# Patient Record
Sex: Female | Born: 2000 | Race: Black or African American | Hispanic: No | Marital: Single | Smoking: Never smoker
Health system: Southern US, Community
[De-identification: ages and names within clinical notes are randomized; demographics above are authoritative.]

## PROBLEM LIST (undated history)

## (undated) HISTORY — PX: DILATION AND CURETTAGE OF UTERUS: SHX78

---

## 2022-04-29 ENCOUNTER — Emergency Department (HOSPITAL_COMMUNITY)
Admission: EM | Admit: 2022-04-29 | Discharge: 2022-04-30 | Disposition: A | Discharge status: Home / Self Care | Payer: Medicaid - Out of State | Attending: Emergency Medicine | Admitting: Emergency Medicine

## 2022-04-29 ENCOUNTER — Emergency Department (HOSPITAL_COMMUNITY): Payer: Medicaid - Out of State

## 2022-04-29 DIAGNOSIS — R002 Palpitations: Secondary | ICD-10-CM | POA: Insufficient documentation

## 2022-04-29 DIAGNOSIS — I420 Dilated cardiomyopathy: Secondary | ICD-10-CM | POA: Diagnosis not present

## 2022-04-29 DIAGNOSIS — N9489 Other specified conditions associated with female genital organs and menstrual cycle: Secondary | ICD-10-CM | POA: Insufficient documentation

## 2022-04-29 DIAGNOSIS — R197 Diarrhea, unspecified: Secondary | ICD-10-CM | POA: Insufficient documentation

## 2022-04-29 DIAGNOSIS — R42 Dizziness and giddiness: Secondary | ICD-10-CM | POA: Diagnosis present

## 2022-04-29 DIAGNOSIS — R531 Weakness: Secondary | ICD-10-CM | POA: Diagnosis not present

## 2022-04-29 DIAGNOSIS — N390 Urinary tract infection, site not specified: Secondary | ICD-10-CM | POA: Insufficient documentation

## 2022-04-29 LAB — I-STAT BETA HCG BLOOD, ED (MC, WL, AP ONLY): I-stat hCG, quantitative: <5 m[IU]/mL (ref <5)

## 2022-04-29 LAB — CBC
HCT: 38.2 % (ref 36.0–46.0)
Hemoglobin: 12.3 g/dL (ref 12.0–15.0)
MCH: 24.5 pg — ABNORMAL LOW (ref 26.0–34.0)
MCHC: 32.2 g/dL (ref 30.0–36.0)
MCV: 75.9 fL — ABNORMAL LOW (ref 80.0–100.0)
Platelets: 427 10*3/uL — ABNORMAL HIGH (ref 150–400)
RBC: 5.03 MIL/uL (ref 3.87–5.11)
RDW: 14.5 % (ref 11.5–15.5)
WBC: 6.5 10*3/uL (ref 4.0–10.5)
nRBC: 0 % (ref 0.0–0.2)

## 2022-04-29 LAB — BASIC METABOLIC PANEL
Anion gap: 6 (ref 5–15)
BUN: 11 mg/dL (ref 6–20)
CO2: 26 mmol/L (ref 22–32)
Calcium: 9.5 mg/dL (ref 8.9–10.3)
Chloride: 102 mmol/L (ref 98–111)
Creatinine, Ser: 0.54 mg/dL (ref 0.44–1.00)
GFR, Estimated: >60 mL/min (ref >60)
Glucose, Bld: 73 mg/dL (ref 70–99)
Potassium: 3.5 mmol/L (ref 3.5–5.1)
Sodium: 134 mmol/L — ABNORMAL LOW (ref 135–145)

## 2022-04-29 LAB — TROPONIN I (HIGH SENSITIVITY): Troponin I (High Sensitivity): 2 ng/L (ref <18)

## 2022-04-29 NOTE — ED Triage Notes (Signed)
Pt c/o palpitations, feeling of lightheaded/ weakness, associated SHOB, dizziness x4 days. Denies syncopal episode, N/V, fevers, abd pain. Advises she has "a larger R side of heart w arteries that come out of it." Moved to  73mo ago ?Interpreter [phone] ?

## 2022-04-29 NOTE — ED Provider Triage Note (Signed)
Emergency Medicine Provider Triage Evaluation Note ? ?Susan Carroll , a 21 y.o. female  was evaluated in triage.  Pt complains of palpitations since Friday.  Patient reports that "she has a larger right side than left and blood shoots out of my arteries".  Patient states that she has a history of palpitations.  Patient states that she recently moved down to [place] however she has seen cardiologist in [place].  Patient endorsing lightheadedness, dizziness, weakness.  Patient denies any shortness of breath, loss of consciousness.  Patient also endorsing 1 episode of abdominal pain with blood in stool on Friday. ? ?Review of Systems  ?Positive:  ?Negative:  ? ?Physical Exam  ?There were no vitals taken for this visit. ?Gen:   Awake, no distress   ?Resp:  Normal effort  ?MSK:   Moves extremities without difficulty  ?Other:   ? ?Medical Decision Making  ?Medically screening exam initiated at 4:40 PM.  Appropriate orders placed.  Susan Carroll was informed that the remainder of the evaluation will be completed by another provider, this initial triage assessment does not replace that evaluation, and the importance of remaining in the ED until their evaluation is complete. ? ? ?  ?Azucena Cecil, PA-C ?04/29/22 1641 ? ?

## 2022-04-30 LAB — URINALYSIS, ROUTINE W REFLEX MICROSCOPIC
Bilirubin Urine: NEGATIVE
Glucose, UA: NEGATIVE mg/dL
Ketones, ur: NEGATIVE mg/dL
Nitrite: NEGATIVE
Protein, ur: NEGATIVE mg/dL
Specific Gravity, Urine: 1.011 (ref 1.005–1.030)
pH: 6 (ref 5.0–8.0)

## 2022-04-30 LAB — TROPONIN I (HIGH SENSITIVITY): Troponin I (High Sensitivity): <2 ng/L (ref <18)

## 2022-04-30 MED ORDER — DICYCLOMINE HCL 20 MG PO TABS: 20.0000 mg | ORAL_TABLET | Freq: Two times a day (BID) | ORAL | 0 refills | Status: DC | PRN

## 2022-04-30 MED ORDER — DICYCLOMINE HCL 10 MG PO CAPS
10.0000 mg | ORAL_CAPSULE | Freq: Once | ORAL | Status: AC
  Administered 2022-04-30: 10 mg via ORAL
  Filled 2022-04-30: qty 1

## 2022-04-30 MED ORDER — LOPERAMIDE HCL 2 MG PO CAPS
4.0000 mg | ORAL_CAPSULE | Freq: Once | ORAL | Status: AC
Start: 2022-04-30 — End: 2022-04-30
  Administered 2022-04-30: 4 mg via ORAL
  Filled 2022-04-30: qty 2

## 2022-04-30 MED ORDER — LOPERAMIDE HCL 2 MG PO CAPS: 2.0000 mg | ORAL_CAPSULE | Freq: Four times a day (QID) | ORAL | 0 refills | Status: DC | PRN

## 2022-04-30 MED ORDER — ONDANSETRON 4 MG PO TBDP
8.0000 mg | ORAL_TABLET | Freq: Once | ORAL | Status: AC
Start: 2022-04-30 — End: 2022-04-30
  Administered 2022-04-30: 8 mg via ORAL
  Filled 2022-04-30: qty 2

## 2022-04-30 NOTE — ED Notes (Signed)
Pt verbalizes understanding of discharge instructions. Opportunity for questions and answers were provided. Pt discharged from the ED.   ?

## 2022-04-30 NOTE — ED Provider Notes (Signed)
?[name] EMERGENCY DEPARTMENT ?Provider Note ? ? ?CSN: [id number] ?Arrival date & time: 04/29/22  1533 ? ?  ? ?History ? ?Chief Complaint  ?Patient presents with  ? Palpitations  ? Dizziness  ? Weakness  ? ? ?Susan Carroll is a 21 y.o. female. ? ?21 yo F here with multiple complaints. States that for the last four days she has had increased urinary frequency, diarrhea, decreased appetite and worsening suprapubic pain. No vomitiing. Patient also states that she has dizziness intermittently. She reportedly also was found to have dilated cardiomyopathy at a hospitali in [place]. She is unsure why. Has had intermittent syncopal episodes as well in the last couple months since receiving this diagnosis. She has not established cardiology care in this area yet.  ? ? ?Palpitations ?Associated symptoms: dizziness and weakness   ?Dizziness ?Associated symptoms: palpitations and weakness   ?Weakness ?Associated symptoms: dizziness   ? ?  ? ?Home Medications ?Prior to Admission medications   ?Not on File  ?   ? ?Allergies    ?Patient has no allergy information on record.   ? ?Review of Systems   ?Review of Systems  ?Cardiovascular:  Positive for palpitations.  ?Neurological:  Positive for dizziness and weakness.  ? ?Physical Exam ?Updated Vital Signs ?BP 90/68 (BP Location: Left Arm)   Pulse 81   Temp 98.7 ?F (37.1 ?C) (Oral)   Resp 12   SpO2 100%  ?Physical Exam ?Vitals and nursing note reviewed.  ?Constitutional:   ?   Appearance: She is well-developed.  ?HENT:  ?   Head: Normocephalic and atraumatic.  ?   Mouth/Throat:  ?   Mouth: Mucous membranes are moist.  ?   Pharynx: Oropharynx is clear.  ?Eyes:  ?   Pupils: Pupils are equal, round, and reactive to light.  ?Cardiovascular:  ?   Rate and Rhythm: Normal rate and regular rhythm.  ?Pulmonary:  ?   Effort: Pulmonary effort is normal. No respiratory distress.  ?   Breath sounds: No stridor.  ?Abdominal:  ?   General: Abdomen is flat. There is no  distension.  ?Musculoskeletal:     ?   General: No swelling or tenderness. Normal range of motion.  ?   Cervical back: Normal range of motion.  ?Skin: ?   General: Skin is warm and dry.  ?Neurological:  ?   General: No focal deficit present.  ?   Mental Status: She is alert.  ? ? ?ED Results / Procedures / Treatments   ?Labs ?(all labs ordered are listed, but only abnormal results are displayed) ?Labs Reviewed  ?BASIC METABOLIC PANEL - Abnormal; Notable for the following components:  ?    Result Value  ? Sodium 134 (*)   ? All other components within normal limits  ?CBC - Abnormal; Notable for the following components:  ? MCV 75.9 (*)   ? MCH 24.5 (*)   ? Platelets 427 (*)   ? All other components within normal limits  ?URINALYSIS, ROUTINE W REFLEX MICROSCOPIC  ?I-STAT BETA HCG BLOOD, ED ([facility], [facility], [facility] ONLY)  ?POC URINE PREG, ED  ?TROPONIN I (HIGH SENSITIVITY)  ?TROPONIN I (HIGH SENSITIVITY)  ? ? ?EKG ?EKG Interpretation ? ?Date/Time:  Monday Apr 29 2022 17:01:59 EDT ?Ventricular Rate:  90 ?PR Interval:  126 ?QRS Duration: 76 ?QT Interval:  328 ?QTC Calculation: 401 ?R Axis:   79 ?Text Interpretation: Normal sinus rhythm Right atrial enlargement Borderline ECG No previous ECGs available Confirmed by  Talia Hoheisel, Barbara Cower [phone]) on 04/30/2022 5:10:31 AM ? ?Radiology ?DG Chest 2 View ? ?Result Date: 04/29/2022 ?CLINICAL DATA:  Shortness of breath. EXAM: CHEST - 2 VIEW COMPARISON:  None. FINDINGS: The heart size and mediastinal contours are within normal limits. Both lungs are clear. The visualized skeletal structures are unremarkable. IMPRESSION: No active cardiopulmonary disease. Electronically Signed   By: Aram Candela M.D.   On: 04/29/2022 17:19   ? ?Procedures ?Procedures  ? ? ?Medications Ordered in ED ?Medications  ?dicyclomine (BENTYL) capsule 10 mg (has no administration in time range)  ?ondansetron (ZOFRAN-ODT) disintegrating tablet 8 mg (has no administration in time range)  ? ? ?ED Course/ Medical Decision Making/  A&P ?  ?                        ?Medical Decision Making ?Amount and/or Complexity of Data Reviewed ?Labs: ordered. ? ?Risk ?Prescription drug management. ? ?Will evaluate for UTI here. Encourage PO. Will offer symptomatic treatment for diarrhea and symptoms.  ?Care transferred pending UA and reeval of symptoms for disposition. Outpatient cardiology referral placed.  ? ?Final Clinical Impression(s) / ED Diagnoses ?Final diagnoses:  ?None  ? ? ?Rx / DC Orders ?ED Discharge Orders   ? ? None  ? ?  ? ? ?  ?Marily Memos, MD ?04/30/22 2306 ? ?

## 2022-04-30 NOTE — ED Provider Notes (Signed)
I assumed care from Dr. Clayborne Dana at 7 AM.  Patient waiting on a UA.  UA with moderate hemoglobin, small leukocytes and 6-10 white cells with rare bacteria.  At this time do not feel that this suggest a UTI.  Patient most likely with colitis.  No significant abdominal pain on exam.  Findings were discussed with the patient in Arabic.  She was given an outpatient ambulatory referral to cardiology given her prior findings in [place].  She was given return precautions. ?  Gwyneth Sprout, MD ?04/30/22 [phone] ? ?

## 2022-05-06 ENCOUNTER — Encounter: Payer: Self-pay | Admitting: Internal Medicine

## 2022-05-06 ENCOUNTER — Ambulatory Visit (INDEPENDENT_AMBULATORY_CARE_PROVIDER_SITE_OTHER): Payer: Medicaid - Out of State | Admitting: Internal Medicine

## 2022-05-06 VITALS — BP 108/62 | HR 78 | Ht 66.0 in | Wt 170.0 lb

## 2022-05-06 DIAGNOSIS — I420 Dilated cardiomyopathy: Secondary | ICD-10-CM | POA: Diagnosis not present

## 2022-05-06 NOTE — Progress Notes (Signed)
?Cardiology Office Note:   ? ?Date:  05/06/2022  ? ?ID:  [name], DOB 21-Oct-2001, MRN [id number] ? ?PCP:  Pcp, No ?  ?[facility] Providers ?Cardiologist:  None    ? ?Referring MD: Marily Memos, MD  ? ?No chief complaint on file. ??Cardiomyopathy ? ?History of Present Illness:   ? ?Susan Carroll is a 21 y.o. female with a hx of ?dilated CM. ? ?I don't have the data. She reported hx of this in the ED with complaints of uro/gyn symptoms.  She was told by a doctor in [place] that she had an issue with her heart. She was told her "right side was bigger than her left".  A year ago she said she had SOB , CP. She denies lower extremity edema, PND or orthopnea. Family hx remarkable for paternal grandfather with CAD.  ? ? ?Current Medications: ?Current Outpatient Medications on File Prior to Visit  ?Medication Sig Dispense Refill  ? loperamide (IMODIUM) 2 MG capsule Take 1 capsule (2 mg total) by mouth 4 (four) times daily as needed for diarrhea or loose stools. 12 capsule 0  ? ?No current facility-administered medications on file prior to visit.  ? ? ? ?Allergies:   Patient has no known allergies.  ? ?Social History  ? ?Socioeconomic History  ? Marital status: Single  ?  Spouse name: Not on file  ? Number of children: Not on file  ? Years of education: Not on file  ? Highest education level: Not on file  ?Occupational History  ? Not on file  ?Tobacco Use  ? Smoking status: Never  ? Smokeless tobacco: Never  ?Substance and Sexual Activity  ? Alcohol use: Never  ? Drug use: Never  ? Sexual activity: Not Currently  ?Other Topics Concern  ? Not on file  ?Social History Narrative  ? Not on file  ? ?Social Determinants of Health  ? ?Financial Resource Strain: Not on file  ?Food Insecurity: Not on file  ?Transportation Needs: Not on file  ?Physical Activity: Not on file  ?Stress: Not on file  ?Social Connections: Not on file  ?  ? ?Family History: ?The patient's per above ? ?ROS:   ?Please see the history of present  illness.    ? All other systems reviewed and are negative. ? ?EKGs/Labs/Other Studies Reviewed:   ? ?The following studies were reviewed today: ? ? ?EKG:  EKG is  ordered today.  The ekg ordered today demonstrates  ? ?05/06/2022-Sinus rhythm with PACs ? ?Recent Labs: ?04/29/2022: BUN 11; Creatinine, Ser 0.54; Hemoglobin 12.3; Platelets 427; Potassium 3.5; Sodium 134  ?Recent Lipid Panel ?No results found for: CHOL, TRIG, HDL, CHOLHDL, VLDL, LDLCALC, LDLDIRECT ? ? ?Risk Assessment/Calculations:   ?  ? ?    ? ?Physical Exam:   ? ?VS:  ? ?Vitals:  ? 05/06/22 0849  ?BP: 108/62  ?Pulse: 78  ?SpO2: 98%  ? ? ? ?Wt Readings from Last 3 Encounters:  ?05/06/22 170 lb (77.1 kg) (92 %, Z= 1.42)*  ? ?* Growth percentiles are based on CDC (Girls, 2-20 Years) data.  ?  ? ?GEN:  Well nourished, well developed in no acute distress ?HEENT: Normal ?NECK: No JVD; No carotid bruits ?LYMPHATICS: No lymphadenopathy ?CARDIAC: RRR, no murmurs, rubs, gallops ?RESPIRATORY:  Clear to auscultation without rales, wheezing or rhonchi  ?ABDOMEN: Soft, non-tender, non-distended ?MUSCULOSKELETAL:  No edema; No deformity  ?SKIN: Warm and dry ?NEUROLOGIC:  Alert and oriented x 3 ?PSYCHIATRIC:  Normal affect  ? ?  ASSESSMENT:   ? ??Cardiomyopathy: There are no records from [place]. She does not have signs of CHF. No significant family hx. Will obtain an echo. If this is normal, she does not need to follow with cardiology. If it is abnormal will discuss further work-up. For isolated RV dilation would plan for CMR.  ? ?PLAN:   ? ?In order of problems listed above: ? ?TTE ?Follow up as needed ( will reschedule if abnormal) ? ?   ? ?Medication Adjustments/Labs and Tests Ordered: ?Current medicines are reviewed at length with the patient today.  Concerns regarding medicines are outlined above.  ?Orders Placed This Encounter  ?Procedures  ? EKG 12-Lead  ? ECHOCARDIOGRAM COMPLETE  ? ?No orders of the defined types were placed in this encounter. ? ? ?Patient  Instructions  ?Medication Instructions:  ?Your physician recommends that you continue on your current medications as directed. Please refer to the Current Medication list given to you today. ? ?*If you need a refill on your cardiac medications before your next appointment, please call your pharmacy* ? ?Testing/Procedures: ?Your physician has requested that you have an echocardiogram. Echocardiography is a painless test that uses sound waves to create images of your heart. It provides your doctor with information about the size and shape of your heart and how well your heart?s chambers and valves are working. This procedure takes approximately one hour. There are no restrictions for this procedure. ? ?Follow-Up: ?At [facility], you and your health needs are our priority.  As part of our continuing mission to provide you with exceptional heart care, we have created designated Provider Care Teams.  These Care Teams include your primary Cardiologist (physician) and Advanced Practice Providers (APPs -  Physician Assistants and Nurse Practitioners) who all work together to provide you with the care you need, when you need it. ? ?We recommend signing up for the patient portal called "MyChart".  Sign up information is provided on this After Visit Summary.  MyChart is used to connect with patients for Virtual Visits (Telemedicine).  Patients are able to view lab/test results, encounter notes, upcoming appointments, etc.  Non-urgent messages can be sent to your provider as well.   ?To learn more about what you can do with MyChart, go to [email or web address].   ? ?Your next appointment:   ?AS NEEDED with Dr. Wyline Mood ? ? ? ?Important Information About Sugar ? ? ? ? ? ?  ? ?Signed, ?Maisie Fus, MD  ?05/06/2022 9:19 AM    ?[facility] ?

## 2022-05-06 NOTE — Patient Instructions (Signed)
Medication Instructions:  ?Your physician recommends that you continue on your current medications as directed. Please refer to the Current Medication list given to you today. ? ?*If you need a refill on your cardiac medications before your next appointment, please call your pharmacy* ? ?Testing/Procedures: ?Your physician has requested that you have an echocardiogram. Echocardiography is a painless test that uses sound waves to create images of your heart. It provides your doctor with information about the size and shape of your heart and how well your heart?s chambers and valves are working. This procedure takes approximately one hour. There are no restrictions for this procedure. ? ?Follow-Up: ?At [facility], you and your health needs are our priority.  As part of our continuing mission to provide you with exceptional heart care, we have created designated Provider Care Teams.  These Care Teams include your primary Cardiologist (physician) and Advanced Practice Providers (APPs -  Physician Assistants and Nurse Practitioners) who all work together to provide you with the care you need, when you need it. ? ?We recommend signing up for the patient portal called "MyChart".  Sign up information is provided on this After Visit Summary.  MyChart is used to connect with patients for Virtual Visits (Telemedicine).  Patients are able to view lab/test results, encounter notes, upcoming appointments, etc.  Non-urgent messages can be sent to your provider as well.   ?To learn more about what you can do with MyChart, go to [email or web address].   ? ?Your next appointment:   ?AS NEEDED with Dr. Harl Bowie ? ? ? ?Important Information About Sugar ? ? ? ? ? ? ?

## 2022-05-21 ENCOUNTER — Ambulatory Visit (HOSPITAL_COMMUNITY): Payer: Medicaid - Out of State | Attending: Internal Medicine

## 2022-05-21 DIAGNOSIS — I420 Dilated cardiomyopathy: Secondary | ICD-10-CM | POA: Diagnosis present

## 2022-05-21 LAB — ECHOCARDIOGRAM COMPLETE
Area-P 1/2: 2.83 cm2
S' Lateral: 3.1 cm

## 2022-06-14 ENCOUNTER — Encounter: Payer: Self-pay | Admitting: *Deleted

## 2022-09-11 ENCOUNTER — Emergency Department (HOSPITAL_COMMUNITY): Payer: Self-pay

## 2022-09-11 ENCOUNTER — Emergency Department (HOSPITAL_COMMUNITY)
Admission: EM | Admit: 2022-09-11 | Discharge: 2022-09-11 | Disposition: A | Discharge status: Home / Self Care | Payer: Medicaid - Out of State | Attending: Emergency Medicine | Admitting: Emergency Medicine

## 2022-09-11 ENCOUNTER — Other Ambulatory Visit: Payer: Self-pay

## 2022-09-11 DIAGNOSIS — R404 Transient alteration of awareness: Secondary | ICD-10-CM

## 2022-09-11 DIAGNOSIS — R4189 Other symptoms and signs involving cognitive functions and awareness: Secondary | ICD-10-CM | POA: Insufficient documentation

## 2022-09-11 DIAGNOSIS — R42 Dizziness and giddiness: Secondary | ICD-10-CM | POA: Insufficient documentation

## 2022-09-11 DIAGNOSIS — R1110 Vomiting, unspecified: Secondary | ICD-10-CM | POA: Insufficient documentation

## 2022-09-11 LAB — RAPID URINE DRUG SCREEN, HOSP PERFORMED
Amphetamines: NOT DETECTED
Barbiturates: NOT DETECTED
Benzodiazepines: NOT DETECTED
Cocaine: NOT DETECTED
Opiates: NOT DETECTED
Tetrahydrocannabinol: NOT DETECTED

## 2022-09-11 LAB — CBC WITH DIFFERENTIAL/PLATELET
Abs Immature Granulocytes: 0.01 10*3/uL (ref 0.00–0.07)
Basophils Absolute: 0.1 10*3/uL (ref 0.0–0.1)
Basophils Relative: 1 %
Eosinophils Absolute: 0 10*3/uL (ref 0.0–0.5)
Eosinophils Relative: 0 %
HCT: 37.9 % (ref 36.0–46.0)
Hemoglobin: 11.7 g/dL — ABNORMAL LOW (ref 12.0–15.0)
Immature Granulocytes: 0 %
Lymphocytes Relative: 21 %
Lymphs Abs: 0.7 10*3/uL (ref 0.7–4.0)
MCH: 24.7 pg — ABNORMAL LOW (ref 26.0–34.0)
MCHC: 30.9 g/dL (ref 30.0–36.0)
MCV: 80.1 fL (ref 80.0–100.0)
Monocytes Absolute: 0.1 10*3/uL (ref 0.1–1.0)
Monocytes Relative: 4 %
Neutro Abs: 2.6 10*3/uL (ref 1.7–7.7)
Neutrophils Relative %: 74 %
Platelets: 416 10*3/uL — ABNORMAL HIGH (ref 150–400)
RBC: 4.73 MIL/uL (ref 3.87–5.11)
RDW: 14.3 % (ref 11.5–15.5)
WBC: 3.5 10*3/uL — ABNORMAL LOW (ref 4.0–10.5)
nRBC: 0 % (ref 0.0–0.2)

## 2022-09-11 LAB — COMPREHENSIVE METABOLIC PANEL
ALT: 12 U/L (ref 0–44)
AST: 22 U/L (ref 15–41)
Albumin: 4.1 g/dL (ref 3.5–5.0)
Alkaline Phosphatase: 52 U/L (ref 38–126)
Anion gap: 8 (ref 5–15)
BUN: 11 mg/dL (ref 6–20)
CO2: 19 mmol/L — ABNORMAL LOW (ref 22–32)
Calcium: 9.2 mg/dL (ref 8.9–10.3)
Chloride: 108 mmol/L (ref 98–111)
Creatinine, Ser: 0.66 mg/dL (ref 0.44–1.00)
GFR, Estimated: >60 mL/min (ref >60)
Glucose, Bld: 121 mg/dL — ABNORMAL HIGH (ref 70–99)
Potassium: 3.8 mmol/L (ref 3.5–5.1)
Sodium: 135 mmol/L (ref 135–145)
Total Bilirubin: 0.5 mg/dL (ref 0.3–1.2)
Total Protein: 7.4 g/dL (ref 6.5–8.1)

## 2022-09-11 LAB — URINALYSIS, ROUTINE W REFLEX MICROSCOPIC
Bilirubin Urine: NEGATIVE
Glucose, UA: NEGATIVE mg/dL
Hgb urine dipstick: NEGATIVE
Ketones, ur: NEGATIVE mg/dL
Leukocytes,Ua: NEGATIVE
Nitrite: NEGATIVE
Protein, ur: NEGATIVE mg/dL
Specific Gravity, Urine: 1.044 — ABNORMAL HIGH (ref 1.005–1.030)
pH: 5 (ref 5.0–8.0)

## 2022-09-11 LAB — I-STAT BETA HCG BLOOD, ED (MC, WL, AP ONLY): I-stat hCG, quantitative: <5 m[IU]/mL (ref <5)

## 2022-09-11 LAB — SALICYLATE LEVEL: Salicylate Lvl: <7 mg/dL — ABNORMAL LOW (ref 7.0–30.0)

## 2022-09-11 LAB — ETHANOL: Alcohol, Ethyl (B): <10 mg/dL (ref <10)

## 2022-09-11 LAB — TROPONIN I (HIGH SENSITIVITY)
Troponin I (High Sensitivity): <2 ng/L (ref <18)
Troponin I (High Sensitivity): <2 ng/L (ref <18)

## 2022-09-11 LAB — ACETAMINOPHEN LEVEL
Acetaminophen (Tylenol), Serum: 76 ug/mL — ABNORMAL HIGH (ref 10–30)
Acetaminophen (Tylenol), Serum: 45 ug/mL — ABNORMAL HIGH (ref 10–30)

## 2022-09-11 MED ORDER — LORAZEPAM 2 MG/ML IJ SOLN: 0.5000 mg | Freq: Once | INTRAMUSCULAR | Status: DC

## 2022-09-11 MED ORDER — LACTATED RINGERS IV BOLUS
1000.0000 mL | Freq: Once | INTRAVENOUS | Status: AC
  Administered 2022-09-11: 1000 mL via INTRAVENOUS

## 2022-09-11 MED ORDER — GADOBUTROL 1 MMOL/ML IV SOLN
6.0000 mL | Freq: Once | INTRAVENOUS | Status: AC | PRN
  Administered 2022-09-11: 6 mL via INTRAVENOUS

## 2022-09-11 MED ORDER — IOHEXOL 350 MG/ML SOLN
75.0000 mL | Freq: Once | INTRAVENOUS | Status: AC | PRN
  Administered 2022-09-11: 75 mL via INTRAVENOUS

## 2022-09-11 MED ORDER — ONDANSETRON HCL 4 MG/2ML IJ SOLN
4.0000 mg | Freq: Once | INTRAMUSCULAR | Status: AC
  Administered 2022-09-11: 4 mg via INTRAVENOUS
  Filled 2022-09-11: qty 2

## 2022-09-11 NOTE — ED Notes (Signed)
Pt ambulatory to and from restroom with steady gait 

## 2022-09-11 NOTE — Progress Notes (Signed)
Patient currently in MRI.

## 2022-09-11 NOTE — ED Notes (Addendum)
This RN notified by NT Alexa that patient was actively throwing up in the trash can. This RN reached out to MD and asked for order of Zofran.

## 2022-09-11 NOTE — ED Notes (Signed)
Pt tearful, not answering questions; attempted to communicate using arabic interpreter; pt still not answering questions

## 2022-09-11 NOTE — ED Notes (Addendum)
Pt's mother at bedside; pt now answering questions; endorses upper abdominal pain that started yesterday w/ n/v

## 2022-09-11 NOTE — ED Triage Notes (Signed)
Pt bib ems from [employer]; ems reports pt c/o lightheadedness, went to office; on ems arrival, pt alert, non verbal, flaccid, not following commands; hx enlarged heart; no allergies, no meds; hr 60, cbg 125, 99% RA, bp 106/64

## 2022-09-11 NOTE — Discharge Instructions (Addendum)
Your testing today is reassuring.  There is no evidence of stroke.  Seizure seems unlikely as well but you should not drive or operate heavy machinery until you are seen by the neurologist. Return to the ED with new or worsening symptoms.

## 2022-09-11 NOTE — ED Notes (Signed)
Patient transported to CT 

## 2022-09-11 NOTE — ED Provider Notes (Signed)
Patient signed out to me at 1600 by Dr. Manus Gunning pending MRI, EEG and CT abdomen and pelvis.  In short this is a 21 year old female Arabic speaking with no significant past medical history that presented to the emergency department after an episode of altered mental status where she was awake but not speaking or following commands.  Initial work-up here showed a mildly elevated Tylenol level that is downtrending and otherwise within normal range.  She was evaluated by neurology who recommended MRI and EEG to rule out seizure.  She is also receiving a CT abdomen and pelvis in the setting of her abdominal pain with nausea and vomiting.  Upon my evaluation, the patient is awake and alert resting bed comfortably no acute distress.  She is complaining of nausea.  Her heart is regular rate and rhythm, abdomen is soft and nontender.  She has no focal neurologic deficits.   Phoebe Sharps, DO 09/11/22 1906

## 2022-09-11 NOTE — ED Provider Notes (Signed)
[facility] EMERGENCY DEPARTMENT Provider Note   CSN: [id number] Arrival date & time: 09/11/22  1055     History  No chief complaint on file.   Susan Carroll is a 21 y.o. female.  Level 5 caveat for altered mental status.  Patient brought in from her high school with "unresponsiveness".  She is not able to give any history and is nonverbal and not following commands.  Per EMS the patient was complaining of lightheadedness while in class and went to the office.  After arrival to the office patient was not speaking and not following commands and is flaccid for EMS and "catatonic".  EMS reports stable vital signs and no fever.  Does have a history of dilated cardiomyopathy and "leaky valves".  No known psychiatric history. No reported trauma.  Patient unable to give any history.  Does not follow commands and does not speak.  She appears tearful.  Arabic interpreter used and patient did not speak either.  At 11:30 AM, patient is awake and alert and answering questions.  She is but speaks some [place].  History obtained through translator.  States she went to school this morning feeling weak after vomiting all night long.  States she vomited about 8 or 9 times since last night that was nonbilious and nonbloody.  No diarrhea or fever.  She remembers coming in to the office at school and feeling weak.  She does recall medical staff speaking with her but states she could not respond.  States this is happened 2 times previously without a clear diagnosis.  She thought it could be due to her cardiomyopathy.    She is uncertain why she could not talk or follow commands when she first got here.  She denies any suicidal or homicidal thoughts.  She denies hearing any voices.  Denies using any drugs.  States she is prescribed no medications.   The history is provided by the patient and the EMS personnel. The history is limited by the condition of the patient.       Home  Medications Prior to Admission medications   Medication Sig Start Date End Date Taking? Authorizing Provider  loperamide (IMODIUM) 2 MG capsule Take 1 capsule (2 mg total) by mouth 4 (four) times daily as needed for diarrhea or loose stools. 04/30/22   Mesner, Barbara Cower, MD      Allergies    Patient has no known allergies.    Review of Systems   Review of Systems  Unable to perform ROS: Patient nonverbal    Physical Exam Updated Vital Signs BP 112/70   Pulse (!) 52   Temp 98.1 F (36.7 C) (Axillary)   Resp 11   SpO2 100%  Physical Exam Vitals and nursing note reviewed.  Constitutional:      General: She is not in acute distress.    Appearance: She is well-developed.     Comments: Tearful, does not speak  HENT:     Head: Normocephalic and atraumatic.     Mouth/Throat:     Pharynx: No oropharyngeal exudate.  Eyes:     Conjunctiva/sclera: Conjunctivae normal.     Pupils: Pupils are equal, round, and reactive to light.  Neck:     Comments: No meningismus. Cardiovascular:     Rate and Rhythm: Normal rate and regular rhythm.     Heart sounds: Normal heart sounds. No murmur heard. Pulmonary:     Effort: Pulmonary effort is normal. No respiratory distress.  Breath sounds: Normal breath sounds.  Abdominal:     Palpations: Abdomen is soft.     Tenderness: There is no abdominal tenderness. There is no guarding or rebound.  Musculoskeletal:        General: No tenderness. Normal range of motion.     Cervical back: Normal range of motion and neck supple.     Comments: No clonus  Skin:    General: Skin is warm.  Neurological:     Mental Status: She is alert.     Motor: No abnormal muscle tone.     Comments: Arms and legs bilaterally fall to the bed against gravity.  She will not follow commands.  She does protect her face from falling arm. Uncooperative for neurological testing.  Will not follow commands for cranial nerve testing.  Psychiatric:        Behavior: Behavior  normal.     ED Results / Procedures / Treatments   Labs (all labs ordered are listed, but only abnormal results are displayed) Labs Reviewed  CBC WITH DIFFERENTIAL/PLATELET - Abnormal; Notable for the following components:      Result Value   WBC 3.5 (*)    Hemoglobin 11.7 (*)    MCH 24.7 (*)    Platelets 416 (*)    All other components within normal limits  COMPREHENSIVE METABOLIC PANEL - Abnormal; Notable for the following components:   CO2 19 (*)    Glucose, Bld 121 (*)    All other components within normal limits  ACETAMINOPHEN LEVEL - Abnormal; Notable for the following components:   Acetaminophen (Tylenol), Serum 76 (*)    All other components within normal limits  SALICYLATE LEVEL - Abnormal; Notable for the following components:   Salicylate Lvl <7.0 (*)    All other components within normal limits  URINALYSIS, ROUTINE W REFLEX MICROSCOPIC - Abnormal; Notable for the following components:   Specific Gravity, Urine 1.044 (*)    All other components within normal limits  ETHANOL  RAPID URINE DRUG SCREEN, HOSP PERFORMED  ACETAMINOPHEN LEVEL  I-STAT BETA HCG BLOOD, ED ([facility], [facility], [facility] ONLY)  TROPONIN I (HIGH SENSITIVITY)  TROPONIN I (HIGH SENSITIVITY)    EKG EKG Interpretation  Date/Time:  Wednesday September 11 2022 11:07:53 EDT Ventricular Rate:  51 PR Interval:  128 QRS Duration: 91 QT Interval:  437 QTC Calculation: 403 R Axis:   65 Text Interpretation: Sinus rhythm Atrial premature complexes Borderline ST elevation, lateral leads No significant change was found Confirmed by Glynn Octave [phone]) on 09/11/2022 11:31:33 AM  Radiology CT Head Wo Contrast  Result Date: 09/11/2022 CLINICAL DATA:  Altered mental status EXAM: CT HEAD WITHOUT CONTRAST TECHNIQUE: Contiguous axial images were obtained from the base of the skull through the vertex without intravenous contrast. RADIATION DOSE REDUCTION: This exam was performed according to the departmental  dose-optimization program which includes automated exposure control, adjustment of the mA and/or kV according to patient size and/or use of iterative reconstruction technique. COMPARISON:  None Available. FINDINGS: Brain: No acute intracranial findings are seen. There are no signs of bleeding within the cranium. Ventricles are not dilated. There is no focal edema or focal mass effect. Vascular: Unremarkable. Skull: Unremarkable. Sinuses/Orbits: There is mucosal thickening in ethmoid sinus. Other: None. IMPRESSION: No acute intracranial findings are seen in noncontrast CT brain. Electronically Signed   By: Ernie Avena M.D.   On: 09/11/2022 11:54    Procedures .Critical Care  Performed by: Glynn Octave, MD Authorized by: Glynn Octave, MD  Critical care provider statement:    Critical care time (minutes):  30   Critical care time was exclusive of:  Separately billable procedures and treating other patients   Critical care was necessary to treat or prevent imminent or life-threatening deterioration of the following conditions:  CNS failure or compromise   Critical care was time spent personally by me on the following activities:  Development of treatment plan with patient or surrogate, discussions with consultants, evaluation of patient's response to treatment, examination of patient, ordering and review of laboratory studies, ordering and review of radiographic studies, ordering and performing treatments and interventions, pulse oximetry, re-evaluation of patient's condition and review of old charts   I assumed direction of critical care for this patient from another provider in my specialty: no     Care discussed with: admitting provider       Medications Ordered in ED Medications - No data to display  ED Course/ Medical Decision Making/ A&P                           Medical Decision Making Amount and/or Complexity of Data Reviewed Labs: ordered. Decision-making details  documented in ED Course. Radiology: ordered and independent interpretation performed. Decision-making details documented in ED Course. ECG/medicine tests: ordered and independent interpretation performed. Decision-making details documented in ED Course.  Risk Prescription drug management.   Mental status change of uncertain etiology, possibly psychiatric in nature.  Vitals are stable.  No fever.  Does not cooperate for formal neurological testing but does protect her face from falling arm.  Patient mental status has improved and she is now alert and interactive and following commands.  Her neurological exam is nonfocal. CT head is negative for acute pathology.  Results reviewed and interpreted by me.  Acetaminophen level is 76.  Will trend.  LFTs are normal.  Suspect likely psychiatric etiology of patient's unresponsive episode.  Consider nonepileptic seizure however.  Discussed with Dr. Selina Cooley of neurology.  She will evaluate patient and recommends MRI and EEG.  She does favor psychogenic etiology as well.  APAP level nontoxic and downtrending. Patient now awake and following commands with a nonfocal exam. Abdomen soft. States she has had similar unresponsive episodes after vomiting in the past. She denies SI or HI.  Some concern for subclinical seizure. Dr. Selina Cooley of neurology to evaluate. Driving precautions given.   MRI and EEG pending at shift change. Anticipate discharge home with neurology and psychiatry followup.  Dr. Dayna Ramus to assume care.         Final Clinical Impression(s) / ED Diagnoses Final diagnoses:  None    Rx / DC Orders ED Discharge Orders     None         Phelix Fudala, Jeannett Senior, MD 09/11/22 1758

## 2022-09-11 NOTE — Consult Note (Signed)
NEUROLOGY CONSULTATION NOTE   Date of service: September 11, 2022 Patient Name: Susan Carroll MRN:  [id number] DOB:  2001-06-16 Reason for consult: spells of unresponsiveness Requesting physician: Dr. Glynn Octave _ _ _   _ __   _ __ _ _  __ __   _ __   __ _  History of Present Illness   Hx obtained from patient and mother with assistance of translator for Arabic.  This is a 21 year old woman who presented via EMS from school for episode of inability to speak or move. Pmhx unremarkable except OSH dx cardiomyopathy. Patient subsequently seen by cardiology in our system who noted normal TTE other than mild-to-moderate TR and discharged her from clinic with no follow up needed.  Patient reports that this morning she felt extremely nauseated and had approximately 8 bouts of emesis within an hour and a half.  Subsequently she became unable to speak or move any of her extremities.  She did not lose consciousness during this time.  She did not follow commands during the spell for EDP but was able to protect her face from falling arm and does recall the event. She has had 2 prior episodes that were similar although less severe.  Both were preceded by vomiting.  She had no other prodromal symptoms.  She is back to baseline on my examination.  She did not have any urinary incontinence, abnormal movements, or tongue biting. She has no other relevant pmhx. Head CT in ED unremarkable (personal review). She has never had MRI or EEG and does not have an outpatient neurologist.   ROS   Per HPI: all other systems reviewed and are negative  Past History   Fhx - noncontributory  NKDA  Medications   (Not in a hospital admission)    No current facility-administered medications for this encounter.  Current Outpatient Medications:    loperamide (IMODIUM) 2 MG capsule, Take 1 capsule (2 mg total) by mouth 4 (four) times daily as needed for diarrhea or loose stools., Disp: 12 capsule, Rfl: 0  Vitals    Vitals:   09/11/22 1400 09/11/22 1430 09/11/22 1715 09/11/22 1815  BP:  91/61 116/67 128/83  Pulse:   (!) 58 70  Resp: 16 20 16 18   Temp:      TempSrc:      SpO2:   100% 100%  Weight:      Height:         Body mass index is 26.57 kg/m.  Physical Exam   Physical Exam Gen: A&O x4, NAD HEENT: Atraumatic, normocephalic;mucous membranes moist; oropharynx clear, tongue without atrophy or fasciculations. Neck: Supple, trachea midline. Resp: CTAB, no w/r/r CV: RRR, no m/g/r; nml S1 and S2. 2+ symmetric peripheral pulses. Abd: soft/NT/ND; nabs x 4 quad Extrem: Nml bulk; no cyanosis, clubbing, or edema.  Neuro: *MS: A&O x4. Follows multi-step commands.  *Speech: fluid, nondysarthric, able to name and repeat *CN:    I: Deferred   II,III: PERRLA, VFF by confrontation, optic discs unable to be visualized 2/2 pupillary constriction   III,IV,VI: EOMI w/o nystagmus, no ptosis   V: Sensation intact from V1 to V3 to LT   VII: Eyelid closure was full.  Smile symmetric.   VIII: Hearing intact to voice   IX,X: Voice normal, palate elevates symmetrically    XI: SCM/trap 5/5 bilat   XII: Tongue protrudes midline, no atrophy or fasciculations  *Motor:   Normal bulk.  No tremor, rigidity or bradykinesia. No pronator drift. Very  effort dependent, at least 4/5 diffusely with coaching, no objective e/o focal deficits *Sensory: Intact to light touch, pinprick, temperature vibration throughout. Symmetric. Propioception intact bilat.  No double-simultaneous extinction.  *Coordination:  Finger-to-nose, heel-to-shin, rapid alternating motions were intact. *Reflexes:  2+ and symmetric throughout without clonus; toes down-going bilat *Gait: deferred  Labs   CBC:  Recent Labs  Lab 09/11/22 1104  WBC 3.5*  NEUTROABS 2.6  HGB 11.7*  HCT 37.9  MCV 80.1  PLT 416*    Basic Metabolic Panel:  Lab Results  Component Value Date   NA 135 09/11/2022   K 3.8 09/11/2022   CO2 19 (L) 09/11/2022    GLUCOSE 121 (H) 09/11/2022   BUN 11 09/11/2022   CREATININE 0.66 09/11/2022   CALCIUM 9.2 09/11/2022   GFRNONAA >60 09/11/2022   Lipid Panel: No results found for: "LDLCALC" HgbA1c: No results found for: "HGBA1C" Urine Drug Screen:     Component Value Date/Time   LABOPIA NONE DETECTED 09/11/2022 1210   COCAINSCRNUR NONE DETECTED 09/11/2022 1210   LABBENZ NONE DETECTED 09/11/2022 1210   AMPHETMU NONE DETECTED 09/11/2022 1210   THCU NONE DETECTED 09/11/2022 1210   LABBARB NONE DETECTED 09/11/2022 1210    Alcohol Level     Component Value Date/Time   ETH <10 09/11/2022 1104    Impression   This is a 21 yr old woman presenting after her 3rd spell of being unable to speak or move any extremities. Events are always preceded by vomiting. Semiology is atypical for epileptic seizures given that she was awake during the event and has intact recall but was unable to speak or move any extremities (except to protect her face from falling arm). 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 Exam highly effort-dependent but nonfocal. Given that she has not undergone workup in the past and she has now had 3 events I would recommend MRI brain wwo contrast and routine EEG. Frontal seizures are a possibility which can have a very atypical presentation. If those studies are negative she can be discharged from ED with outpatient neuro f/u.  Recommendations   - MRI brain wwo contrast - rEEG - Please counsel patient not to drive until cleared by outpatient neurology  Will f/u above studies ______________________________________________________________________   Thank you for the opportunity to take part in the care of this patient. If you have any further questions, please contact the neurology consultation attending.  Signed,  Bing Neighbors, MD [facility] [phone]  If 7pm- 7am, please page neurology on call as listed in AMION.  Addendum 1900: MRI brain wwo  contrast WNL personal review. Patient unable to get rEEG this evening therefore may be deferred to outpatient setting. I have placed a referral for outpatient f/u with neurology. OK to discharge from neuro standpoint. Please counsel patient that she should not drive until cleared by outpatient neurology.

## 2022-09-11 NOTE — ED Notes (Signed)
Discharge instructions reviewed with patient. Patient verbalized understanding of instructions. Follow-up care and medications were reviewed. Patient ambulatory with steady gait. VSS upon discharge.  ?

## 2022-09-13 ENCOUNTER — Emergency Department (HOSPITAL_COMMUNITY)
Admission: EM | Admit: 2022-09-13 | Discharge: 2022-09-14 | Disposition: A | Discharge status: Home / Self Care | Payer: Medicaid - Out of State | Attending: Emergency Medicine | Admitting: Emergency Medicine

## 2022-09-13 ENCOUNTER — Encounter (HOSPITAL_COMMUNITY): Payer: Self-pay | Admitting: Emergency Medicine

## 2022-09-13 DIAGNOSIS — H9201 Otalgia, right ear: Secondary | ICD-10-CM

## 2022-09-13 NOTE — ED Provider Triage Note (Signed)
Emergency Medicine Provider Triage Evaluation Note  Susan Carroll , a 21 y.o. female  was evaluated in triage.  Pt complains of needing a neurology outpatient follow-up.  Patient reports that when she was discharged, we did not schedule an appointment for her.  She is try to's call to schedule her own appointment however she does not speak English very well and was unable to talk to someone. She reports that she was only able to speak to the operator.  Review of Systems  Positive:  Negative:   Physical Exam  BP 109/75 (BP Location: Right Arm)   Pulse 61   Temp 98.3 F (36.8 C) (Oral)   Resp 14   SpO2 99%  Gen:   Awake, no distress   Resp:  Normal effort  MSK:   Moves extremities without difficulty  Other:    Medical Decision Making  Medically screening exam initiated at 6:41 PM.  Appropriate orders placed.  FRANCISCA LANGENDERFER was informed that the remainder of the evaluation will be completed by another provider, this initial triage assessment does not replace that evaluation, and the importance of remaining in the ED until their evaluation is complete.  Unsure of how the patient knew that she was talking to the operator given that she reports that she does not understand English however she would respond to some my questioning before the translator was used.  She initially said that we did not give her a follow-up however when it was shown to her that we did on her discharge for work.  She reports that she called but was not able to be seen when he spoke to her language and was only able to access the operator.  Consulted Social work.   Achille Rich, PA-C 09/13/22 1844

## 2022-09-13 NOTE — ED Triage Notes (Addendum)
Patient return to ED after receiving a message in my chart asking for her to complete a feedback survey. Patient does not speak English well and used an Designer, fashion/clothing which made her think the message instructed her to return to the emergency department.  Patient states she received a referral to a neurologists but states that when she called they did not understand her and she was unable to make an appointment.

## 2022-09-13 NOTE — Care Management (Signed)
Patient is still in the [facility] ED waiting room. Patient is a 21 yo Female, Arabic speaking only.  Patient is needing ED follow up. Referred patient to the [facility], placed information on patient's AVS and also forwarded info to the clinic's scheduler to follow up with patient since she is Arabic speaking only. Updated EDP.

## 2022-09-14 ENCOUNTER — Encounter (HOSPITAL_COMMUNITY): Payer: Self-pay | Admitting: Emergency Medicine

## 2022-09-14 NOTE — ED Provider Notes (Signed)
[place] EMERGENCY DEPARTMENT Provider Note  CSN: [id number] Arrival date & time: 09/13/22 1636  Chief Complaint(s) No chief complaint on file.  HPI Susan Carroll is a 21 y.o. 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  Apparently patient received a message in my chart asking for feedback which she believed was instructing her to return to the emergency department.  Patient reports that she also has right ear pain which she wants to get checked out.  She denies any hearing changes.  She initially requested the phone numbers for the place that she was referral to because she reports she tried to make a call and could not understand them because she does not speak [place].  Other than her ear pain, she denies any medical complaints such as headaches, nausea or vomiting, chest pain, shortness of breath, difficulty breathing, fevers or chills.  Arabic  language interpreter used   Past Medical History History reviewed. No pertinent past medical history. There are no problems to display for this patient.  Home Medication(s) Prior to Admission medications   Medication Sig Start Date End Date Taking? Authorizing Provider  loperamide (IMODIUM) 2 MG capsule Take 1 capsule (2 mg total) by mouth 4 (four) times daily as needed for diarrhea or loose stools. 04/30/22   Mesner, Corene Cornea, MD                                                                                                                                    Past Surgical History History reviewed. No pertinent surgical history. Family History No family history on file.  Social History Social History   Tobacco Use   Smoking status: Never   Smokeless tobacco: Never  Substance Use Topics   Alcohol use: Never   Drug use: Never   Allergies Patient has no known allergies.  Review of Systems Review of Systems  All other systems  reviewed and are negative.   Physical Exam Vital Signs  I have reviewed the triage vital signs BP 109/75 (BP Location: Right Arm)   Pulse 61   Temp 98.3 F (36.8 C) (Oral)   Resp 14   SpO2 99%  Physical Exam Vitals and nursing note reviewed.  Constitutional:      General: She is not in acute distress.    Appearance: She is well-developed.  HENT:     Head: Normocephalic and atraumatic.     Right Ear: Tympanic membrane, ear canal and external ear normal.     Left Ear: Tympanic membrane, ear canal and external ear normal.     Mouth/Throat:     Mouth: Mucous membranes are moist.  Eyes:     Pupils: Pupils are equal, round, and reactive to light.  Cardiovascular:     Rate and Rhythm: Normal rate and regular rhythm.     Heart sounds: No murmur  heard. Pulmonary:     Effort: Pulmonary effort is normal. No respiratory distress.     Breath sounds: Normal breath sounds.  Abdominal:     General: Abdomen is flat.     Palpations: Abdomen is soft.     Tenderness: There is no abdominal tenderness.  Musculoskeletal:        General: No tenderness.     Right lower leg: No edema.     Left lower leg: No edema.  Skin:    General: Skin is warm and dry.  Neurological:     General: No focal deficit present.     Mental Status: She is alert. Mental status is at baseline.  Psychiatric:        Mood and Affect: Mood normal.        Behavior: Behavior normal.     ED Results and Treatments Labs (all labs ordered are listed, but only abnormal results are displayed) Labs Reviewed - No data to display                                                                                                                        Radiology No results found.  Pertinent labs & imaging results that were available during my care of the patient were reviewed by me and considered in my medical decision making (see MDM for details).  Medications Ordered in ED Medications - No data to display                                                                                                                                    Procedures Procedures  (including critical care time)  Medical Decision Making / ED Course   MDM:  21 year old female presenting to the emergency department with right ear pain and miscommunication  Ear appears normal.  No sign of otitis media or otitis externa.  Reviewed records, patient had recent MRI which was negative.  She needs to follow-up with outpatient neurology.  Provided phone number for this again.  Social work was consulted and help patient arrange follow-up with primary physician.  Patient denies any other symptoms and reports the main reason she was here because of a misunderstanding about the message she received in her MyChart about feedback and thought she should come back to the emergency department. Will discharge patient to home. All questions answered. Patient comfortable with plan of discharge. Return precautions discussed  with patient and specified on the after visit summary.       Additional history obtained: -External records from outside source obtained and reviewed including: Chart review including previous notes, labs, imaging, consultation notes Co morbidities that complicate the patient evaluation History reviewed. No pertinent past medical history.    Dispostion: Discharge    Final Clinical Impression(s) / ED Diagnoses Final diagnoses:  Right ear pain     This chart was dictated using voice recognition software.  Despite best efforts to proofread,  errors can occur which can change the documentation meaning.    Lonell Grandchild, MD 09/14/22 [phone]

## 2022-10-10 ENCOUNTER — Ambulatory Visit: Payer: Self-pay | Admitting: Neurology

## 2022-10-10 ENCOUNTER — Encounter: Payer: Self-pay | Admitting: Neurology

## 2023-03-20 ENCOUNTER — Ambulatory Visit: Payer: Medicaid Other | Admitting: Student

## 2023-05-19 ENCOUNTER — Ambulatory Visit: Payer: Medicaid Other | Admitting: Obstetrics and Gynecology

## 2023-07-21 ENCOUNTER — Ambulatory Visit: Payer: Medicaid Other | Admitting: Obstetrics and Gynecology

## 2023-09-14 ENCOUNTER — Ambulatory Visit (HOSPITAL_COMMUNITY)
Admission: EM | Admit: 2023-09-14 | Discharge: 2023-09-14 | Disposition: A | Discharge status: Home / Self Care | Payer: Medicaid Other | Attending: Emergency Medicine | Admitting: Emergency Medicine

## 2023-09-14 ENCOUNTER — Encounter (HOSPITAL_COMMUNITY): Payer: Self-pay

## 2023-09-14 DIAGNOSIS — N939 Abnormal uterine and vaginal bleeding, unspecified: Secondary | ICD-10-CM | POA: Diagnosis not present

## 2023-09-14 DIAGNOSIS — R102 Pelvic and perineal pain: Secondary | ICD-10-CM | POA: Diagnosis not present

## 2023-09-14 LAB — POCT URINALYSIS DIP (MANUAL ENTRY)
Bilirubin, UA: NEGATIVE
Glucose, UA: NEGATIVE mg/dL
Ketones, POC UA: NEGATIVE mg/dL
Leukocytes, UA: NEGATIVE
Nitrite, UA: NEGATIVE
Protein Ur, POC: NEGATIVE mg/dL
Spec Grav, UA: 1.025 (ref 1.010–1.025)
Urobilinogen, UA: 0.2 U/dL
pH, UA: 5.5 (ref 5.0–8.0)

## 2023-09-14 LAB — POCT URINE PREGNANCY: Preg Test, Ur: NEGATIVE

## 2023-09-14 MED ORDER — MEDROXYPROGESTERONE ACETATE 5 MG PO TABS: 5.0000 mg | ORAL_TABLET | Freq: Every day | ORAL | 0 refills | Status: DC

## 2023-09-14 MED ORDER — ACETAMINOPHEN 325 MG PO TABS: 650.0000 mg | ORAL_TABLET | Freq: Four times a day (QID) | ORAL | 0 refills | Status: AC | PRN

## 2023-09-14 MED ORDER — IBUPROFEN 800 MG PO TABS: 800.0000 mg | ORAL_TABLET | Freq: Three times a day (TID) | ORAL | 0 refills | Status: AC | PRN

## 2023-09-14 MED ORDER — ONDANSETRON HCL 4 MG PO TABS: 4.0000 mg | ORAL_TABLET | Freq: Four times a day (QID) | ORAL | 0 refills | Status: AC | PRN

## 2023-09-14 NOTE — Discharge Instructions (Addendum)
Start taking Provera for vaginal bleeding. You can alternate between Ibuprofen and Tylenol as needed for pain. You can take Zofran as needed for nausea and vomiting with pain medication. Please call OBGYN that I have added on your paperwork tomorrow to schedule an appointment to be seen for possible ovarian cysts and irregular periods. If you develop worsening abdominal pain, excessive nausea and vomiting, worsening vaginal bleeding, and high fevers please seek immediate medical treatment at the ER.   ???? ?????? Provera ????? ?????? ???????. ????? ??????? ??? ???????????? ??????????? ??? ?????? ?????? ?????. ????? ????? ?????? ??? ?????? ????? ??????? ?????? ?? ?????? ?????. ???? ??????? ?? OBGYN ???? ????? ??? ?????? ???? ?????? ???? ???? ????? ?????? ???????? ??????? ??????? ??? ????????. ??? ??? ????? ?? ???? ?? ????? ????? ?????? ???????? ?????? ??????? ?????? ?????? ???????? ??????? ?? ???? ???????? ????? ??? ?????? ????? ?????? ?? ???? ???????. abda bitanawul Provera lieilaj alnazif almihbali. [place] altanawub bayn al'iibubrufin waltaaylinul hasab alhajat litakhfif al'almi. [place] tanawul zufran hasab alhajat lieilaj alghathayan walqay' mae muskinat al'almi. yurjaa alaitisal bi OBGYN aladhi 'adafath 'iilaa 'awraqik ghdan litahdid maweid lifahs 'akyas almabyad almuhtamalat waldawrat alshahriat ghayr almuntazimati. 'iidha kunt tueani min alam fi albatn tazdad sw'an, walghathayan walqay' almufarta, watafaqum alnazif almihbali, wairtifae fi darajat alhararati, fayurjaa talab aleilaj altibiyi alfawrii fi ghurfat altawarii.

## 2023-09-14 NOTE — ED Triage Notes (Signed)
Patient here today with c/o pelvic pain and lower abd pain for a while now but has been constant over the past 4 days. Pain shoots to right leg and her period has been irregular.

## 2023-09-14 NOTE — ED Provider Notes (Signed)
[facility]    CSN: [id number] Arrival date & time: 09/14/23  1350      History   Chief Complaint Chief Complaint  Patient presents with   Abdominal Pain    HPI Susan Carroll is a 22 y.o. female.   Patient presents with pelvic pain and lower abdominal pain for over a month but states that pain has been constant over the last 4 days. Patient endorses pain that radiates to right leg. Patient states that she had a normal period at the end of last month, but states that she began having vaginal bleeding yesterday. Denies being sexually active.  Denies vaginal discharge, pain with urination, fever, back pain, nausea, vomiting, and diarrhea.   Abdominal Pain Associated symptoms: fatigue and vaginal bleeding   Associated symptoms: no chest pain, no chills, no diarrhea, no dysuria, no fever, no hematuria, no nausea, no shortness of breath, no vaginal discharge and no vomiting     History reviewed. No pertinent past medical history.  There are no problems to display for this patient.   History reviewed. No pertinent surgical history.  OB History   No obstetric history on file.      Home Medications    Prior to Admission medications   Medication Sig Start Date End Date Taking? Authorizing Provider  acetaminophen (TYLENOL) 325 MG tablet Take 2 tablets (650 mg total) by mouth every 6 (six) hours as needed for mild pain or moderate pain. 09/14/23  Yes Susann Givens, Cortny Bambach A, NP  ibuprofen (ADVIL) 800 MG tablet Take 1 tablet (800 mg total) by mouth 3 (three) times daily as needed for mild pain, moderate pain or cramping. 09/14/23  Yes Susann Givens, Carolanne Mercier A, NP  medroxyPROGESTERone (PROVERA) 5 MG tablet Take 1 tablet (5 mg total) by mouth daily for 10 days. 09/14/23 09/24/23 Yes Wynonia Lawman A, NP  ondansetron (ZOFRAN) 4 MG tablet Take 1 tablet (4 mg total) by mouth every 6 (six) hours as needed for nausea or vomiting. 09/14/23  Yes Letta Kocher, NP    Family  History History reviewed. No pertinent family history.  Social History Social History   Tobacco Use   Smoking status: Never   Smokeless tobacco: Never  Vaping Use   Vaping status: Never Used  Substance Use Topics   Alcohol use: Never   Drug use: Never     Allergies   Patient has no known allergies.   Review of Systems Review of Systems  Constitutional:  Positive for fatigue. Negative for chills and fever.  Respiratory:  Negative for shortness of breath.   Cardiovascular:  Negative for chest pain.  Gastrointestinal:  Positive for abdominal pain. Negative for diarrhea, nausea and vomiting.  Genitourinary:  Positive for menstrual problem, pelvic pain and vaginal bleeding. Negative for decreased urine volume, difficulty urinating, dysuria, flank pain, frequency, genital sores, hematuria, urgency, vaginal discharge and vaginal pain.  Neurological:  Negative for dizziness, weakness, light-headedness and headaches.     Physical Exam Triage Vital Signs ED Triage Vitals  Encounter Vitals Group     BP 09/14/23 1458 91/67     Systolic BP Percentile --      Diastolic BP Percentile --      Pulse Rate 09/14/23 1458 83     Resp 09/14/23 1458 16     Temp 09/14/23 1458 97.9 F (36.6 C)     Temp Source 09/14/23 1458 Oral     SpO2 09/14/23 1458 97 %     Weight --  Height --      Head Circumference --      Peak Flow --      Pain Score 09/14/23 1456 6     Pain Loc --      Pain Education --      Exclude from Growth Chart --    No data found.  Updated Vital Signs BP 91/67 (BP Location: Left Arm)   Pulse 83   Temp 97.9 F (36.6 C) (Oral)   Resp 16   LMP 09/13/2023 (Approximate)   SpO2 97%   Visual Acuity Right Eye Distance:   Left Eye Distance:   Bilateral Distance:    Right Eye Near:   Left Eye Near:    Bilateral Near:     Physical Exam Vitals and nursing note reviewed.  Constitutional:      General: She is awake. She is not in acute distress.    Appearance:  Normal appearance. She is well-developed and well-groomed. She is not ill-appearing, toxic-appearing or diaphoretic.  Cardiovascular:     Rate and Rhythm: Normal rate.     Heart sounds: Normal heart sounds.  Pulmonary:     Effort: Pulmonary effort is normal. No respiratory distress.     Breath sounds: Normal breath sounds. No wheezing.  Abdominal:     General: Abdomen is flat. Bowel sounds are normal. There is no distension. There are no signs of injury.     Palpations: Abdomen is soft. There is no mass.     Tenderness: There is abdominal tenderness in the right lower quadrant, suprapubic area and left lower quadrant. There is no right CVA tenderness, left CVA tenderness, guarding or rebound. Negative signs include Murphy's sign, Rovsing's sign and McBurney's sign.     Hernia: No hernia is present.     Comments: More tenderness to LLQ than RLQ.  Genitourinary:    Comments: Exam deferred. Skin:    General: Skin is warm and dry.  Neurological:     Mental Status: She is alert.  Psychiatric:        Behavior: Behavior is cooperative.      UC Treatments / Results  Labs (all labs ordered are listed, but only abnormal results are displayed) Labs Reviewed  POCT URINALYSIS DIP (MANUAL ENTRY) - Abnormal; Notable for the following components:      Result Value   Blood, UA moderate (*)    All other components within normal limits  POCT URINE PREGNANCY    EKG   Radiology No results found.  Procedures Procedures (including critical care time)  Medications Ordered in UC Medications - No data to display  Initial Impression / Assessment and Plan / UC Course  I have reviewed the triage vital signs and the nursing notes.  Pertinent labs & imaging results that were available during my care of the patient were reviewed by me and considered in my medical decision making (see chart for details).     Patient presented with pelvic pain and lower abdominal pain for over a month and stated  that the pain has been constant over the last 4 days. Endorses pain that radiates to right leg.  Patient reports having a normal period at the end of last month, but states that she began having heavy vaginal bleeding yesterday with clots. Denies being sexually active. Denies vaginal discharge, pain with urination, fever, back pain, nausea, vomiting, and diarrhea. Upon assessment patient is tender to lower abdomen which is worse to left lower quadrant.  Patient reports history  of ovarian cysts. Patient states she has not establish care with an OB/GYN since moving to the [place]. Prescribed Provera to help with heavy vaginal bleeding. Recommended alternating between Tylenol and ibuprofen for pain as needed. Patient stated that all pain medication makes her nauseous and vomit. Prescribed Zofran to help prevent nausea and vomiting. Discussed follow-up, return, and strict emergency department precautions. Final Clinical Impressions(s) / UC Diagnoses   Final diagnoses:  Vaginal bleeding  Pelvic pain     Discharge Instructions      Start taking Provera for vaginal bleeding. You can alternate between Ibuprofen and Tylenol as needed for pain. You can take Zofran as needed for nausea and vomiting with pain medication. Please call OBGYN that I have added on your paperwork tomorrow to schedule an appointment to be seen for possible ovarian cysts and irregular periods. If you develop worsening abdominal pain, excessive nausea and vomiting, worsening vaginal bleeding, and high fevers please seek immediate medical treatment at the ER.   ???? ?????? Provera ????? ?????? ???????. ????? ??????? ??? ???????????? ??????????? ??? ?????? ?????? ?????. ????? ????? ?????? ??? ?????? ????? ??????? ?????? ?? ?????? ?????. ???? ??????? ?? OBGYN ???? ????? ??? ?????? ???? ?????? ???? ???? ????? ?????? ???????? ??????? ??????? ??? ????????. ??? ??? ????? ?? ???? ?? ????? ????? ?????? ???????? ?????? ??????? ?????? ??????  ???????? ??????? ?? ???? ???????? ????? ??? ?????? ????? ?????? ?? ???? ???????. abda bitanawul Provera lieilaj alnazif almihbali. [place] altanawub bayn al'iibubrufin waltaaylinul hasab alhajat litakhfif al'almi. [place] tanawul zufran hasab alhajat lieilaj alghathayan walqay' mae muskinat al'almi. yurjaa alaitisal bi OBGYN aladhi 'adafath 'iilaa 'awraqik ghdan litahdid maweid lifahs 'akyas almabyad almuhtamalat waldawrat alshahriat ghayr almuntazimati. 'iidha kunt tueani min alam fi albatn tazdad sw'an, walghathayan walqay' almufarta, watafaqum alnazif almihbali, wairtifae fi darajat alhararati, fayurjaa talab aleilaj altibiyi alfawrii fi ghurfat altawarii.    ED Prescriptions     Medication Sig Dispense Auth. Provider   ibuprofen (ADVIL) 800 MG tablet Take 1 tablet (800 mg total) by mouth 3 (three) times daily as needed for mild pain, moderate pain or cramping. 21 tablet Wynonia Lawman A, NP   acetaminophen (TYLENOL) 325 MG tablet Take 2 tablets (650 mg total) by mouth every 6 (six) hours as needed for mild pain or moderate pain. 30 tablet Susann Givens, Karan Inclan A, NP   ondansetron (ZOFRAN) 4 MG tablet Take 1 tablet (4 mg total) by mouth every 6 (six) hours as needed for nausea or vomiting. 20 tablet Wynonia Lawman A, NP   medroxyPROGESTERone (PROVERA) 5 MG tablet Take 1 tablet (5 mg total) by mouth daily for 10 days. 10 tablet Wynonia Lawman A, NP      PDMP not reviewed this encounter.   Wynonia Lawman A, NP 09/14/23 1643

## 2023-09-20 IMAGING — CR DG CHEST 2V
2 series · 2 of 2 positions shown · non-contrast
Comparison: None.

CLINICAL DATA: Shortness of breath.

EXAM:
CHEST - 2 VIEW

[chest pa]
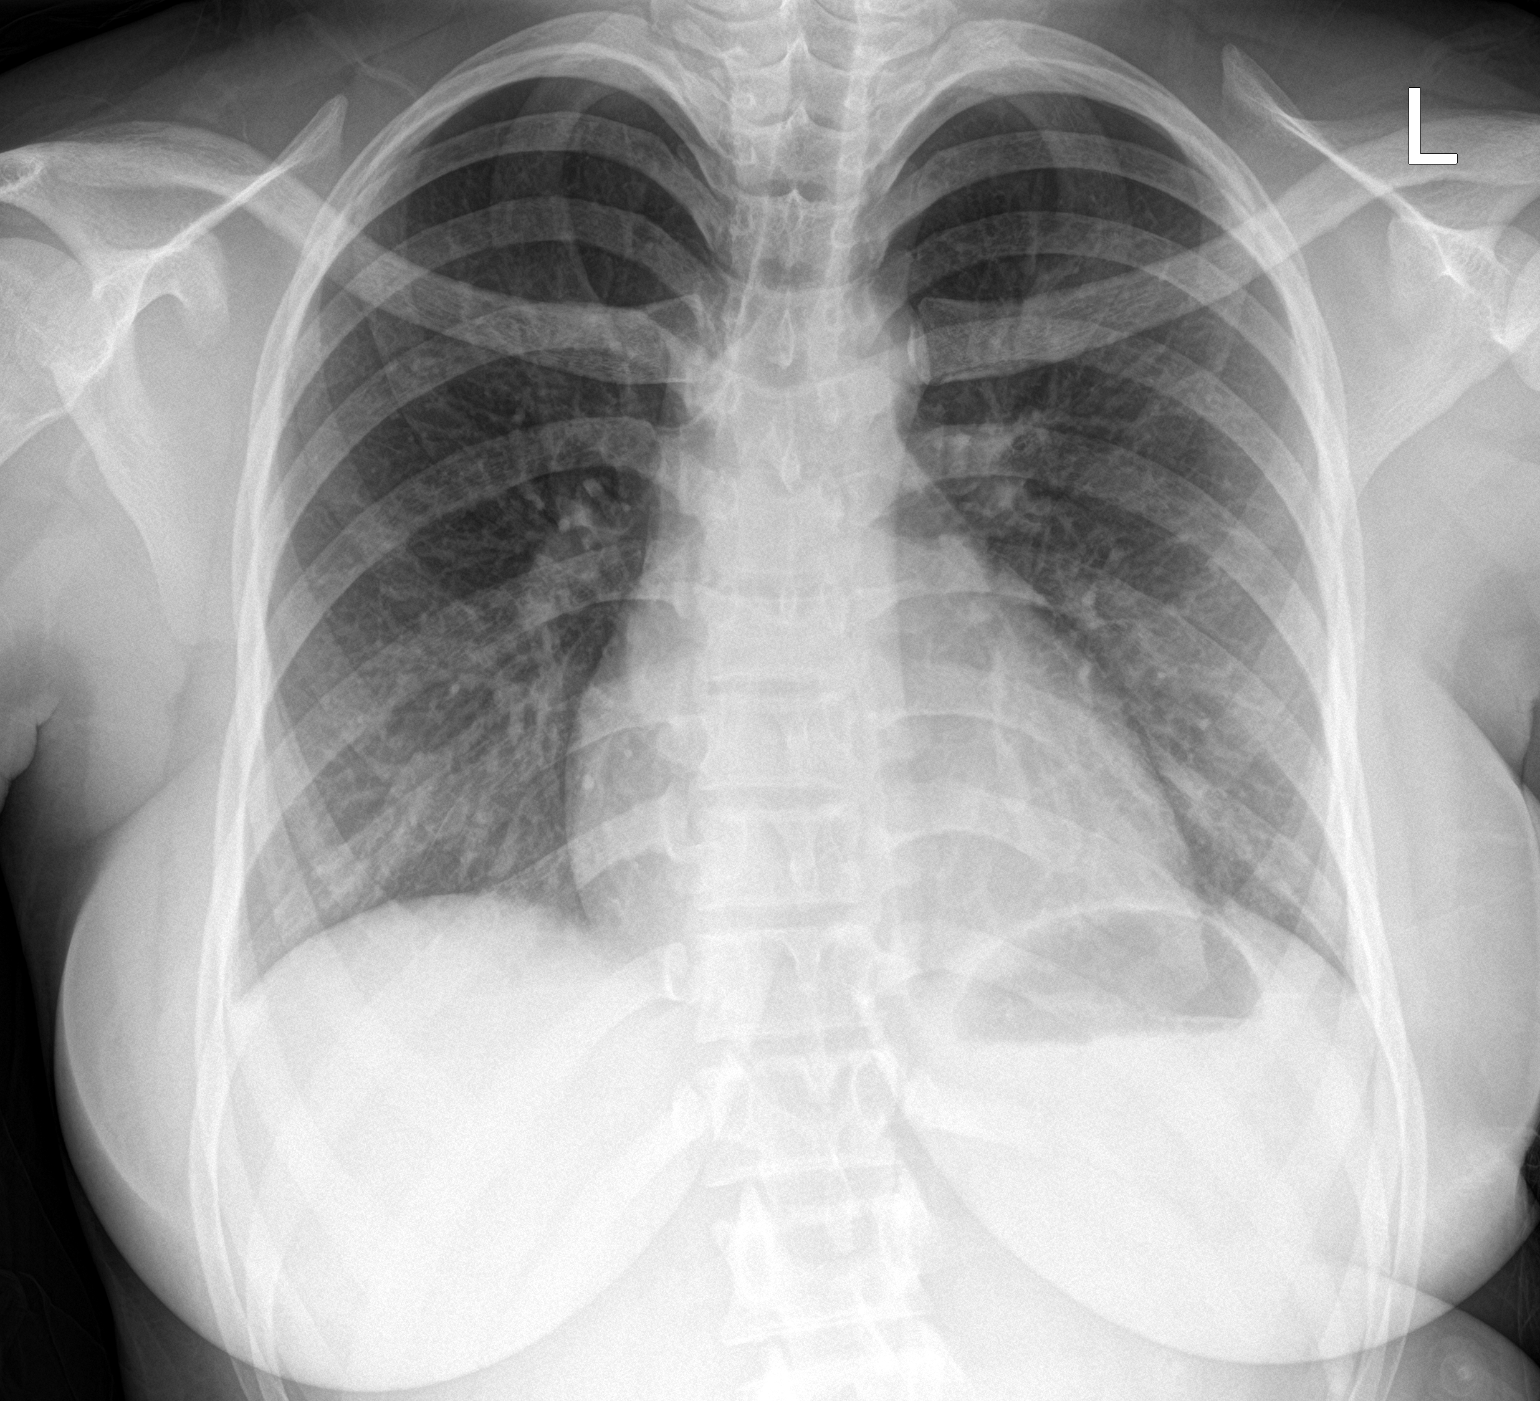

[chest lat]
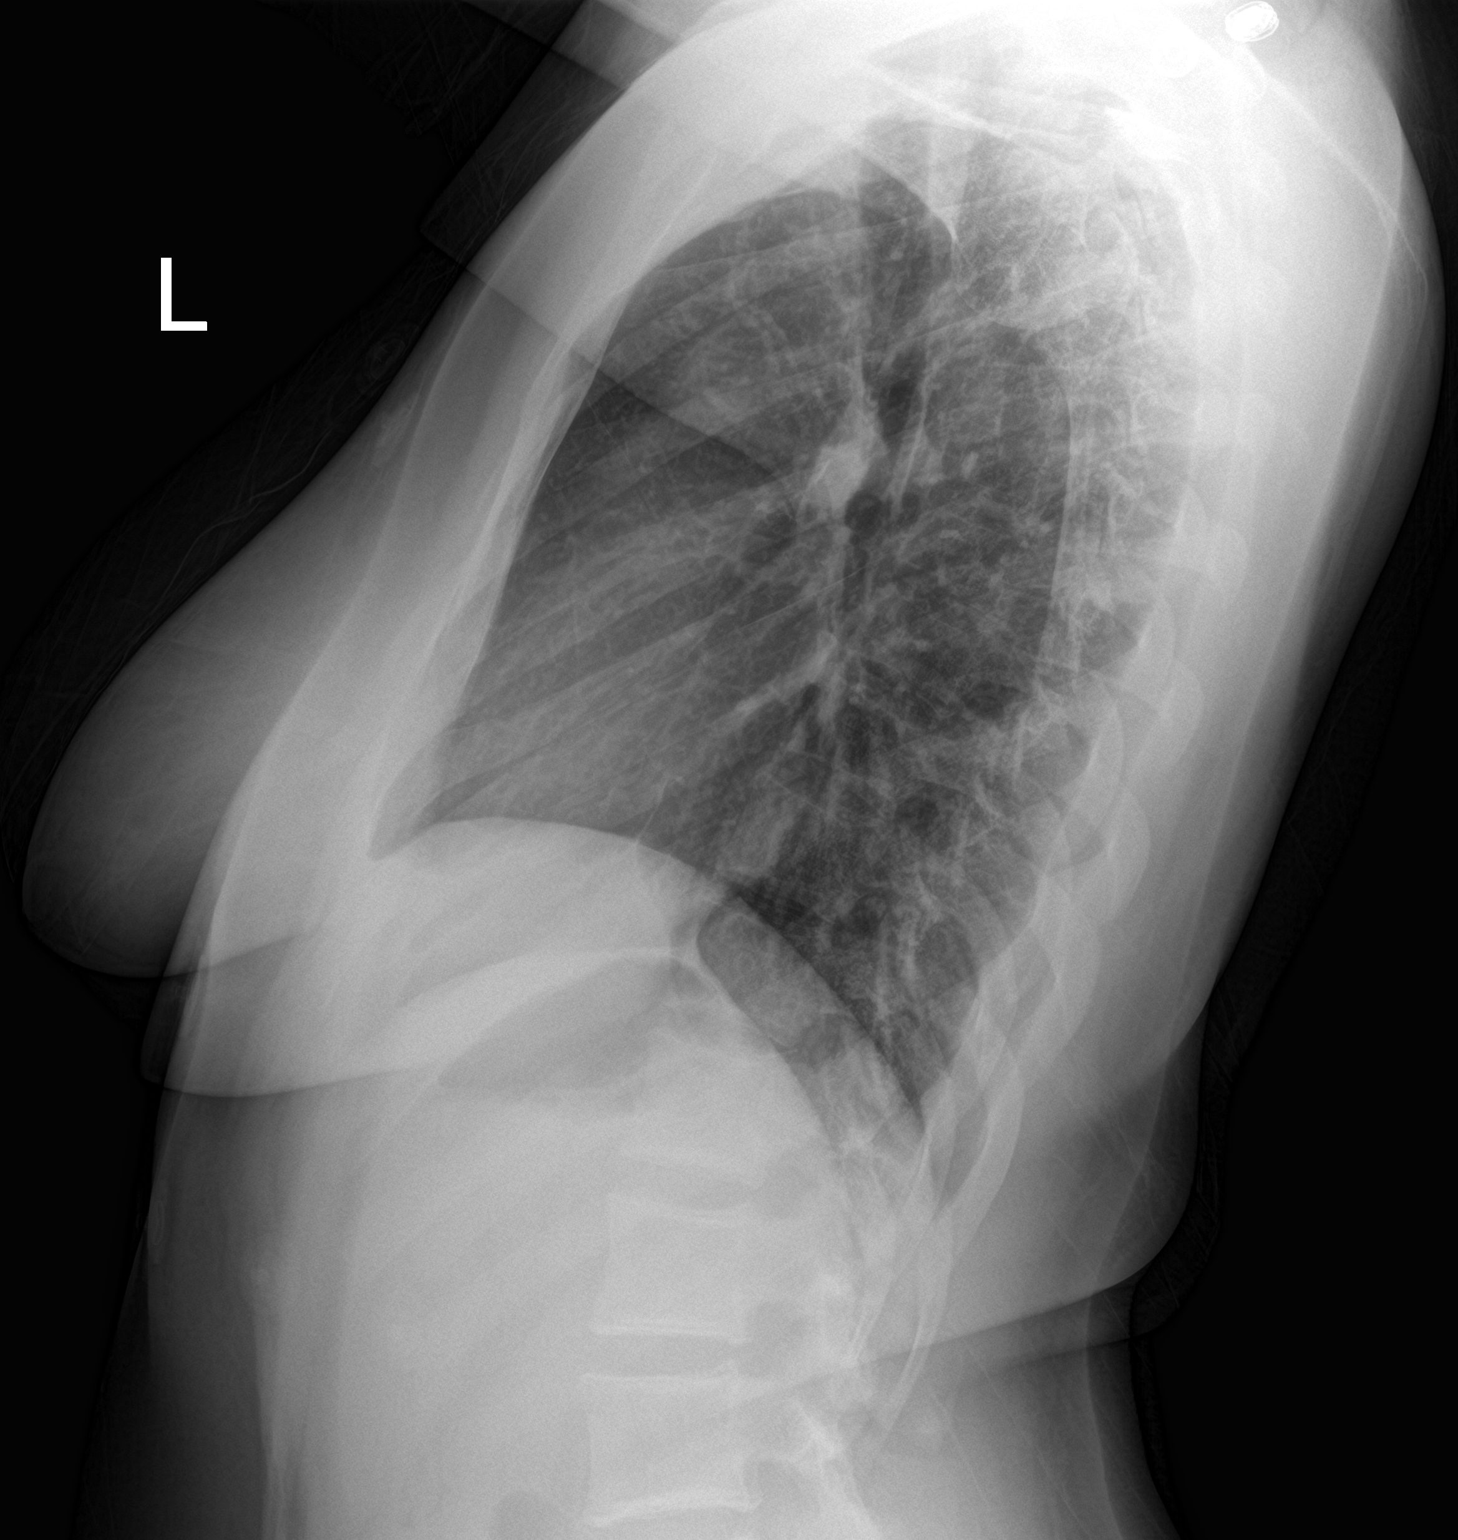

[2 of 2 positions shown; findings below may reference images not displayed]

FINDINGS: The heart size and mediastinal contours are within normal limits.
Both lungs are clear. The visualized skeletal structures are
unremarkable.
IMPRESSION: No active cardiopulmonary disease.

## 2024-02-17 ENCOUNTER — Ambulatory Visit: Payer: Medicaid Other | Admitting: Obstetrics & Gynecology

## 2024-02-17 ENCOUNTER — Other Ambulatory Visit (HOSPITAL_COMMUNITY)
Admission: RE | Admit: 2024-02-17 | Discharge: 2024-02-17 | Disposition: A | Discharge status: Home / Self Care | Payer: Medicaid Other | Source: Ambulatory Visit | Attending: Obstetrics & Gynecology | Admitting: Obstetrics & Gynecology

## 2024-02-17 VITALS — BP 111/77 | HR 66 | Wt 165.0 lb

## 2024-02-17 DIAGNOSIS — N920 Excessive and frequent menstruation with regular cycle: Secondary | ICD-10-CM

## 2024-02-17 MED ORDER — TRANEXAMIC ACID 650 MG PO TABS: 1300.0000 mg | ORAL_TABLET | Freq: Three times a day (TID) | ORAL | 2 refills | Status: DC

## 2024-02-17 NOTE — Progress Notes (Signed)
 Pt states she is in office for ovarian cyst - pt states urgent care told her about cyst. Pt having irregular cycles, abd swelling, vaginal mucous d/c and sharp pain in right side radiates down leg x 2 years - pt states it is getting worse.  Pt having pain with cycles as well. Pt has no birth control, not sexually active.

## 2024-02-17 NOTE — Progress Notes (Signed)
 Patient ID: Susan Carroll, female   DOB: 06/06/2001, 23 y.o.   MRN: [id number]  Chief Complaint  Patient presents with   New Patient (Initial Visit)    HPI Susan Carroll is a 23 y.o. female.  No obstetric history on file. G0 Patient's last menstrual period was 02/03/2024 (approximate). For the last 4-5 months she has noted increased menstrual flow and pain with regular 28 day cycle. Urgent care mentioned she could have ovarian cyst. She was seen 01/28/24 and extensive lab work was done for c/o abdominal pain and bloating.  HPI  No past medical history on file.  No past surgical history on file.  No family history on file.  Social History Social History   Tobacco Use   Smoking status: Never   Smokeless tobacco: Never  Vaping Use   Vaping status: Never Used  Substance Use Topics   Alcohol use: Never   Drug use: Never    No Known Allergies  Current Outpatient Medications  Medication Sig Dispense Refill   acetaminophen (TYLENOL) 325 MG tablet Take 2 tablets (650 mg total) by mouth every 6 (six) hours as needed for mild pain or moderate pain. 30 tablet 0   ibuprofen (ADVIL) 800 MG tablet Take 1 tablet (800 mg total) by mouth 3 (three) times daily as needed for mild pain, moderate pain or cramping. 21 tablet 0   medroxyPROGESTERone (PROVERA) 5 MG tablet Take 1 tablet (5 mg total) by mouth daily for 10 days. 10 tablet 0   ondansetron (ZOFRAN) 4 MG tablet Take 1 tablet (4 mg total) by mouth every 6 (six) hours as needed for nausea or vomiting. 20 tablet 0   No current facility-administered medications for this visit.    Review of Systems Review of Systems  Respiratory: Negative.    Cardiovascular: Negative.   Gastrointestinal:  Positive for abdominal distention, abdominal pain and nausea.  Genitourinary:  Positive for menstrual problem and pelvic pain.    Blood pressure 111/77, pulse 66, weight 165 lb (74.8 kg), last menstrual period 02/03/2024.  Physical Exam Physical  Exam Vitals and nursing note reviewed. Exam conducted with a chaperone present.  Constitutional:      Appearance: Normal appearance.  HENT:     Head: Normocephalic and atraumatic.  Cardiovascular:     Rate and Rhythm: Normal rate.  Pulmonary:     Effort: Pulmonary effort is normal.  Neurological:     General: No focal deficit present.     Mental Status: She is alert.  Psychiatric:        Mood and Affect: Mood normal.        Behavior: Behavior normal.   She declined further PE today  Data Reviewed Results Created Date Observation Date Name Description Value Unit Range Abnormal Flag Note LastModifiedBy Organization Detail LastModifiedTime  01/27/2023 01/28/2023 CBC WITH DIFFERENTIAL/PLATELET WBC 6.3 x10e3/uL 3.4-10.8     Not Available [facility]) [address] [place], [place], [address], Ph [phone] 01/29/2023 19:36:02  01/27/2023 01/28/2023 CBC WITH DIFFERENTIAL/PLATELET RBC 4.89 x10e6/uL 3.77-5.28     Not Available [facility]) [address] [place], [place], [place], [address], Ph [phone] 01/29/2023 19:36:02  01/27/2023 01/28/2023 CBC WITH DIFFERENTIAL/PLATELET hemoglobin 12.3 g/dL 42.5-95.6     Not Available [facility]) [address] [place], [place], [place], [address], Ph [phone] 01/29/2023 19:36:02  01/27/2023 01/28/2023 CBC WITH DIFFERENTIAL/PLATELET hematocrit 38.1 % 34.0-46.6     Not Available [facility]) [address], [place], [place], [address], Ph [phone] 01/29/2023 19:36:02  01/27/2023 01/28/2023 CBC WITH DIFFERENTIAL/PLATELET MCV 78 fL 79-97 below low normal   Not Available [facility]) [address], [place], [place], [address], Ph [phone] 01/29/2023 19:36:02  01/27/2023 01/28/2023 CBC WITH DIFFERENTIAL/PLATELET MCH 25.2 pg 26.6-33.0 below low normal   Not Available [facility]) [address] [place], [place], [address], Ph [phone] 01/29/2023 19:36:02  01/27/2023  01/28/2023 CBC WITH DIFFERENTIAL/PLATELET MCHC 32.3 g/dL 64.3-32.9     Not Available [facility]) [address] [place], [place], [address], Ph [phone] 01/29/2023 19:36:02  01/27/2023 01/28/2023 CBC WITH DIFFERENTIAL/PLATELET RDW 13.3 % 11.7-15.4     Not Available [facility]) [address], [place], [place], [address], Ph [phone] 01/29/2023 19:36:02  01/27/2023 01/28/2023 CBC WITH DIFFERENTIAL/PLATELET platelets 442 x10e3/uL 150-450     Not Available [facility]) [address], [place], [place], [address], Ph [phone] 01/29/2023 19:36:02  01/27/2023 01/28/2023 CBC WITH DIFFERENTIAL/PLATELET neutrophils 46 % not estab.     Not Available [facility]) [address] [place], [place], [address], Ph [phone] 01/29/2023 19:36:02  01/27/2023 01/28/2023 CBC WITH DIFFERENTIAL/PLATELET lymphs 44 % not estab.     Not Available [facility]) [address], [place], [place], [address], Ph [phone] 01/29/2023 19:36:02  01/27/2023 01/28/2023 CBC WITH DIFFERENTIAL/PLATELET monocytes 7 % not estab.     Not Available [facility]) [address] [place], [place], [address], Ph [phone] 01/29/2023 19:36:02  01/27/2023 01/28/2023 CBC WITH DIFFERENTIAL/PLATELET eos 2 % not estab.     Not Available [facility]) [address], [place], [place], [address], Ph [phone] 01/29/2023 19:36:02  01/27/2023 01/28/2023 CBC WITH DIFFERENTIAL/PLATELET basos 1 % not estab.     Not Available [facility]) [address], [place], [place], [address], Ph [phone] 01/29/2023 19:36:02  01/27/2023 01/28/2023 CBC WITH DIFFERENTIAL/PLATELET immature cells NP         Not Available [facility]) [address], [place], [place], [address], Ph [phone] 01/29/2023 19:36:02  01/27/2023 01/28/2023 CBC WITH DIFFERENTIAL/PLATELET neutrophils (absolute) 2.9 x10e3/uL 1.4-7.0     Not Available  [facility]) [address], [place], [place], [address], Ph [phone] 01/29/2023 19:36:02  01/27/2023 01/28/2023 CBC WITH DIFFERENTIAL/PLATELET lymphs (absolute) 2.8 x10e3/uL 0.7-3.1     Not Available [facility]) [address] [place], [place], [address], Ph [phone] 01/29/2023 19:36:02  01/27/2023 01/28/2023 CBC WITH DIFFERENTIAL/PLATELET monocytes(absolute) 0.4 x10e3/uL 0.1-0.9     Not Available [facility]) [address] [place], [place], [place], [address], Ph [phone] 01/29/2023 19:36:02  01/27/2023 01/28/2023 CBC WITH DIFFERENTIAL/PLATELET eos (absolute) 0.1 x10e3/uL 0.0-0.4     Not Available [facility]) [address] [place], [place], [place], [address], Ph [phone] 01/29/2023 19:36:02  01/27/2023 01/28/2023 CBC WITH DIFFERENTIAL/PLATELET baso (absolute) 0.0 x10e3/uL 0.0-0.2     Not Available [facility]) [address] [place], [place], [address], Ph [phone] 01/29/2023 19:36:02  01/27/2023 01/28/2023 CBC WITH DIFFERENTIAL/PLATELET immature granulocytes 0 % not estab.     Not Available [facility]) [address], [place], [place], [address], Ph [phone] 01/29/2023 19:36:02  01/27/2023 01/28/2023 CBC WITH DIFFERENTIAL/PLATELET immature grans (abs) 0.0 x10e3/uL 0.0-0.1     Not Available [facility]) [address], [place], [place],  [address], Ph [phone] 01/29/2023 19:36:02  01/27/2023 01/28/2023 CBC WITH DIFFERENTIAL/PLATELET NRBC NP         Not Available [facility]) [address] [place], [place], [address], Ph [phone] 01/29/2023 19:36:02  01/27/2023 01/28/2023 CBC WITH DIFFERENTIAL/PLATELET hematology comments: NP         Not Available [facility]) [address] [place], [place], [address], Ph [phone] 01/29/2023 19:36:02  01/27/2023 01/28/2023 COMP. METABOLIC PANEL (14) glucose 78 mg/dL 44-03     Not Available [facility]  Lab) [address] [place], [place], [address], Ph [phone] 01/29/2023 19:36:03  01/27/2023 01/28/2023 COMP. METABOLIC PANEL (14) BUN 9 mg/dL 3-29     Not Available [facility]) [address] [place], [place], [address], Ph [phone] 01/29/2023 19:36:03  01/27/2023 01/28/2023 COMP. METABOLIC PANEL (14) creatinine 0.61 mg/dL 1.09-3.23     Not Available [facility]) [address], [place], [place], [address], Ph [phone] 01/29/2023 19:36:03  01/27/2023 01/28/2023 COMP. METABOLIC PANEL (14) eGFR 132 mL/min/1.73 >59     Not Available [facility]) [address], [place], [place], [address], Ph [phone] 01/29/2023 19:36:03  01/27/2023 01/28/2023 COMP. METABOLIC PANEL (14) BUN/creatinine ratio 15   9-23     Not Available [facility]) [address] [place], [place], [address], Ph [phone] 01/29/2023 19:36:03  01/27/2023 01/28/2023 COMP. METABOLIC PANEL (14) sodium 139 mmol/L 134-144     Not Available [facility]) [address] [place], [place], [address], Ph [phone] 01/29/2023 19:36:03  01/27/2023 01/28/2023 COMP. METABOLIC PANEL (14) potassium 4.1 mmol/L 3.5-5.2     Not Available [facility]) [address] [place], [place], [address], Ph [phone] 01/29/2023 19:36:03  01/27/2023 01/28/2023 COMP. METABOLIC PANEL (14) chloride 102 mmol/L 96-106     Not Available [facility]) [address], [place], [place], [address], Ph [phone] 01/29/2023 19:36:03  01/27/2023 01/28/2023 COMP. METABOLIC PANEL (14) carbon dioxide, total 22 mmol/L 20-29     Not Available [facility]) [address] [place], [place], [address], Ph [phone] 01/29/2023 19:36:03  01/27/2023 01/28/2023 COMP. METABOLIC PANEL (14) calcium 10.0 mg/dL 9.5-09.3     Not Available [facility]) [address] [place], [place], [address], Ph [phone] 01/29/2023 19:36:03  01/27/2023  01/28/2023 COMP. METABOLIC PANEL (14) protein, total 8.0 g/dL 2.5-0.5     Not Available [facility]) [address] [place], [place], [address], Ph [phone] 01/29/2023 19:36:03  01/27/2023 01/28/2023 COMP. METABOLIC PANEL (14) albumin 4.9 g/dL 0.9-7.3     Not Available [facility]) [address], [place], [place], [address], Ph [phone] 01/29/2023 19:36:03  01/27/2023 01/28/2023 COMP. METABOLIC PANEL (14) globulin, total 3.1 g/dL 2.2-2.9     Not Available [facility]) [address], [place], [place], [address], Ph [phone] 01/29/2023 19:36:03  01/27/2023 01/28/2023 COMP. METABOLIC PANEL (14) A/G ratio 1.6   1.2-2.2     Not Available [facility]) [address], [place], [place], [address], Ph [phone] 01/29/2023 19:36:03  01/27/2023 01/28/2023 COMP. METABOLIC PANEL (14) bilirubin, total 0.8 mg/dL 3.7-8.5     Not Available [facility]) [address], [place], [place], [address], Ph [phone] 01/29/2023 19:36:03  01/27/2023 01/28/2023 COMP. METABOLIC PANEL (14) alkaline phosphatase 71 IU/L 42-106     Not Available [facility] (  [facility]) [address] [place], [place], [address], Ph [phone] 01/29/2023 19:36:03  01/27/2023 01/28/2023 COMP. METABOLIC PANEL (14) AST (SGOT) 14 IU/L 0-40     Not Available [facility]) [address], [place], [place], [address], Ph [phone] 01/29/2023 19:36:03  01/27/2023 01/28/2023 COMP. METABOLIC PANEL (14) ALT (SGPT) 8 IU/L 0-32     Not Available [facility]) [address], [place], [place], [address], Ph [phone] 01/29/2023 19:36:03  01/27/2023 01/28/2023 LIPID PANEL cholesterol, total 152 mg/dL 244-010     Not Available [facility]) [address] [place], [place], [address], Ph [phone] 01/29/2023 19:36:04  01/27/2023 01/28/2023 LIPID PANEL triglycerides 55 mg/dL 5-95     Not Available [facility]) [address] [place], [place], [address], Ph [phone] 01/29/2023 19:36:04  01/27/2023 01/28/2023 LIPID PANEL HDL cholesterol 53 mg/dL >41     Not Available [facility]) [address] [place], [place], [address], Ph [phone] 01/29/2023 19:36:04  01/27/2023 01/28/2023 LIPID PANEL VLDL cholesterol cal 11 mg/dL 5-57     Not Available [facility]) [address] [place], [place], [place], [address], Ph [phone] 01/29/2023 19:36:04  01/27/2023 01/28/2023 LIPID PANEL LDL chol calc (nih) 88 mg/dL 2-831     Not Available [facility]) [address], [place], [place], [address], Ph [phone] 01/29/2023 19:36:04  01/27/2023 01/28/2023 LIPID PANEL comment: NP         Not Available [facility]) [address] [place], [place], [place], [address], Ph [phone] 01/29/2023 19:36:04  01/27/2023 01/28/2023 IRON AND TIBC iron bind.cap.(TIBC) 404 ug/dL 818-299     Not Available [facility]) [address] [place], [place], [address], Ph [phone] 01/29/2023 19:36:04  01/27/2023 01/28/2023 IRON AND TIBC UIBC 322 ug/dL 510-258     Not Available [facility]) [address], [place], [place], [address], Ph [phone] 01/29/2023 19:36:04  01/27/2023 01/28/2023 IRON AND TIBC iron 82 ug/dL 31-540     Not Available [facility]) [address] [place], [place], [place], [address], Ph [phone] 01/29/2023 19:36:04  01/27/2023 01/28/2023 IRON AND TIBC iron saturation 20 % 15-55     Not Available [facility]) [address], [place], [place], [address], Ph [phone] 01/29/2023 19:36:04  01/27/2023 01/28/2023 H PYLORI BREATH TEST H pylori breath test Positive   negative abnormal   Not Available [facility]) [address] [place], [place], [address], Ph [phone] 01/29/2023 19:36:05  01/27/2023 01/28/2023 TSH W/REFLEX TSH 0.708 uIU/mL 0.450-4.500     Not Available [facility]) [address]  [place], [place], [place], [address], Ph [phone] 01/29/2023 19:36:05  01/27/2023 01/28/2023 HEMOGLOBIN A1C hemoglobin A1C 5.6 % 4.8-5.6   Prediabetes: 5.7 - 6.4 Diabetes: >6.4 Glycemic control for adults with diabetes: <7.0 Not Available [facility]) [address] [place], [place], [address], Ph [phone] 01/29/2023 19:36:06  01/27/2023 01/29/2023 F079-IGE GLUTEN F079-IgE gluten <0.10 kU/L class 0   Levels of Specific IgE Class Description of Class --------------------------- ----- -------------------- < 0.10 0 Negative 0.10 - 0.31 0/I Equivocal/Low 0.32 - 0.55 I Low 0.56 - 1.40 II Moderate 1.41 - 3.90 III High 3.91 - 19.00 IV Very High 19.01 - 100.00 V Very High >100.00 VI Very High Not Available [facility]) [address] [place], [place], [place], [address], Ph [phone]  01/29/2023 19:36:06  01/27/2023 01/28/2023 F079-IGG GLUTEN F079-IgG gluten <2.0 ug/mL 0.0-1.9     Not Available [facility]) [address] [place], [place], [place], [address], Ph [phone] 01/29/2023 19:36:06   Result Notes None recorded.  Problems Reconcile with Patient's ChartProblems Name Problem SNOMED Code Status Onset Date Resolution Date Notes Provider Name and Address Organization Details Record    Assessment Menorrhagia with regular cycle - Plan: Cervicovaginal ancillary only( [place]), tranexamic acid (LYSTEDA) 650 MG TABS tablet   Plan She has been prescribed Provera and ibuprofen in the past. Will Rx Lysteda and vaginal swab was done. RTC 3 months to review progress and consider PE and pap test. Female provider preferred    Scheryl Darter 02/17/2024, 9:04 AM

## 2024-02-18 LAB — CERVICOVAGINAL ANCILLARY ONLY
Bacterial Vaginitis (gardnerella): POSITIVE — AB
Candida Glabrata: NEGATIVE
Candida Vaginitis: POSITIVE — AB
Chlamydia: NEGATIVE
Comment: NEGATIVE
Comment: NEGATIVE
Comment: NEGATIVE
Comment: NEGATIVE
Comment: NEGATIVE
Comment: NORMAL
Neisseria Gonorrhea: NEGATIVE
Trichomonas: NEGATIVE

## 2024-06-15 ENCOUNTER — Other Ambulatory Visit: Payer: Self-pay | Admitting: Physician Assistant

## 2024-06-15 DIAGNOSIS — N920 Excessive and frequent menstruation with regular cycle: Secondary | ICD-10-CM

## 2024-06-17 ENCOUNTER — Ambulatory Visit
Admission: RE | Admit: 2024-06-17 | Discharge: 2024-06-17 | Discharge status: Home / Self Care | Source: Ambulatory Visit | Attending: Physician Assistant | Admitting: Physician Assistant

## 2024-06-17 DIAGNOSIS — N920 Excessive and frequent menstruation with regular cycle: Secondary | ICD-10-CM

## 2024-09-21 ENCOUNTER — Emergency Department (HOSPITAL_COMMUNITY)
Admission: EM | Admit: 2024-09-21 | Discharge: 2024-09-22 | Disposition: A | Discharge status: Home / Self Care | Attending: Emergency Medicine | Admitting: Emergency Medicine

## 2024-09-21 ENCOUNTER — Encounter (HOSPITAL_COMMUNITY): Payer: Self-pay | Admitting: Emergency Medicine

## 2024-09-21 ENCOUNTER — Encounter (HOSPITAL_COMMUNITY): Payer: Self-pay | Admitting: *Deleted

## 2024-09-21 ENCOUNTER — Other Ambulatory Visit: Payer: Self-pay

## 2024-09-21 ENCOUNTER — Ambulatory Visit (HOSPITAL_COMMUNITY): Admission: EM | Admit: 2024-09-21 | Discharge: 2024-09-21 | Disposition: A | Discharge status: Home / Self Care

## 2024-09-21 DIAGNOSIS — R1031 Right lower quadrant pain: Secondary | ICD-10-CM

## 2024-09-21 DIAGNOSIS — R1030 Lower abdominal pain, unspecified: Secondary | ICD-10-CM

## 2024-09-21 DIAGNOSIS — K625 Hemorrhage of anus and rectum: Secondary | ICD-10-CM | POA: Diagnosis not present

## 2024-09-21 LAB — CBC
HCT: 38.4 % (ref 36.0–46.0)
Hemoglobin: 12.4 g/dL (ref 12.0–15.0)
MCH: 25.7 pg — ABNORMAL LOW (ref 26.0–34.0)
MCHC: 32.3 g/dL (ref 30.0–36.0)
MCV: 79.7 fL — ABNORMAL LOW (ref 80.0–100.0)
Platelets: 419 K/uL — ABNORMAL HIGH (ref 150–400)
RBC: 4.82 MIL/uL (ref 3.87–5.11)
RDW: 13.9 % (ref 11.5–15.5)
WBC: 8.2 K/uL (ref 4.0–10.5)
nRBC: 0 % (ref 0.0–0.2)

## 2024-09-21 LAB — URINALYSIS, ROUTINE W REFLEX MICROSCOPIC
Bacteria, UA: NONE SEEN
Bilirubin Urine: NEGATIVE
Glucose, UA: NEGATIVE mg/dL
Ketones, ur: NEGATIVE mg/dL
Nitrite: NEGATIVE
Protein, ur: NEGATIVE mg/dL
Specific Gravity, Urine: 1.009 (ref 1.005–1.030)
pH: 6 (ref 5.0–8.0)

## 2024-09-21 LAB — COMPREHENSIVE METABOLIC PANEL WITH GFR
ALT: 10 U/L (ref 0–44)
AST: 19 U/L (ref 15–41)
Albumin: 4.1 g/dL (ref 3.5–5.0)
Alkaline Phosphatase: 51 U/L (ref 38–126)
Anion gap: 10 (ref 5–15)
BUN: 9 mg/dL (ref 6–20)
CO2: 24 mmol/L (ref 22–32)
Calcium: 9.5 mg/dL (ref 8.9–10.3)
Chloride: 103 mmol/L (ref 98–111)
Creatinine, Ser: 0.63 mg/dL (ref 0.44–1.00)
GFR, Estimated: >60 mL/min (ref >60)
Glucose, Bld: 88 mg/dL (ref 70–99)
Potassium: 3.6 mmol/L (ref 3.5–5.1)
Sodium: 137 mmol/L (ref 135–145)
Total Bilirubin: 1.1 mg/dL (ref 0.0–1.2)
Total Protein: 7.7 g/dL (ref 6.5–8.1)

## 2024-09-21 LAB — HCG, SERUM, QUALITATIVE: Preg, Serum: NEGATIVE

## 2024-09-21 NOTE — ED Triage Notes (Addendum)
 Arabic interpreter used during triage.   States she began to have R pelvic pain yesterday, then had 3 episodes of bloody stool in the afternoon, reports BRB. Seen at UC earlier this morning, was sent over for further eval of RLQ pain. Hemoccult + at [facility] At this time, pt states she feels dizzy and is now having cp and pain in R pelvis (10/10).

## 2024-09-21 NOTE — Discharge Instructions (Addendum)
 Go to ER for further evaluation of RLQ abdominal pain, reported BRBPR x 3 last 24 hours(labs/imaging)

## 2024-09-21 NOTE — ED Provider Notes (Signed)
 [facility]    CSN: [id number] Arrival date & time: 09/21/24  1321      History   Chief Complaint No chief complaint on file.   HPI Susan Carroll is a 23 y.o. female.   23 year old female, Susan Carroll, presents to urgent care for evaluation of BRBPR x 2 days, pt states she has had 3 episodes of toilet turning completely red, it was bad in last 24 hours. Pt reports 6/10 pain and points to RLQ, no meds tried. Pt denies vaginal bleeding,vaginal pain,dysuria or hematuria. Pt denies any fall or trauma. Pt denies any NSAID use or known GI issues.  The history is provided by the patient. A language interpreter was used neill).    History reviewed. No pertinent past medical history.  Patient Active Problem List   Diagnosis Date Noted   RLQ abdominal pain 09/21/2024    History reviewed. No pertinent surgical history.  OB History   No obstetric history on file.      Home Medications    Prior to Admission medications   Medication Sig Start Date End Date Taking? Authorizing Provider  phentermine 15 MG capsule TAKE 1 CAPSULE BY MOUTH EVERY DAY FOR WEIGHT LOSS   Yes [provider]  acetaminophen  (TYLENOL ) 325 MG tablet Take 2 tablets (650 mg total) by mouth every 6 (six) hours as needed for mild pain or moderate pain. 09/14/23   Johnie Flaming A, NP  ibuprofen  (ADVIL ) 800 MG tablet Take 1 tablet (800 mg total) by mouth 3 (three) times daily as needed for mild pain, moderate pain or cramping. 09/14/23   Johnie Flaming A, NP  ondansetron  (ZOFRAN ) 4 MG tablet Take 1 tablet (4 mg total) by mouth every 6 (six) hours as needed for nausea or vomiting. 09/14/23   Johnie Flaming A, NP  tranexamic acid  (LYSTEDA ) 650 MG TABS tablet Take 2 tablets (1,300 mg total) by mouth 3 (three) times daily. Take during menses for a maximum of five days 02/17/24   Eveline Lynwood MATSU, MD    Family History History reviewed. No pertinent family history.  Social History Social  History   Tobacco Use   Smoking status: Never   Smokeless tobacco: Never  Vaping Use   Vaping status: Never Used  Substance Use Topics   Alcohol use: Never   Drug use: Never     Allergies   Patient has no known allergies.   Review of Systems Review of Systems  Constitutional:  Negative for fever.  Gastrointestinal:  Positive for abdominal pain and blood in stool. Negative for constipation, diarrhea, nausea and vomiting.  All other systems reviewed and are negative.    Physical Exam Triage Vital Signs ED Triage Vitals [09/21/24 1439]  Encounter Vitals Group     BP 101/69     Girls Systolic BP Percentile      Girls Diastolic BP Percentile      Boys Systolic BP Percentile      Boys Diastolic BP Percentile      Pulse Rate 79     Resp 16     Temp 98.1 F (36.7 C)     Temp Source Oral     SpO2 98 %     Weight      Height      Head Circumference      Peak Flow      Pain Score      Pain Loc      Pain Education  Exclude from Growth Chart    No data found.  Updated Vital Signs BP 101/69 (BP Location: Right Arm)   Pulse 79   Temp 98.1 F (36.7 C) (Oral)   Resp 16   LMP 09/15/2024 (Exact Date)   SpO2 98%   Visual Acuity Right Eye Distance:   Left Eye Distance:   Bilateral Distance:    Right Eye Near:   Left Eye Near:    Bilateral Near:     Physical Exam Vitals and nursing note reviewed. Exam conducted with a chaperone present.  Cardiovascular:     Rate and Rhythm: Normal rate and regular rhythm.     Heart sounds: Normal heart sounds.  Pulmonary:     Effort: Pulmonary effort is normal.  Abdominal:     General: Bowel sounds are normal.     Palpations: Abdomen is soft.     Tenderness: There is abdominal tenderness in the right lower quadrant. There is no guarding or rebound.  Genitourinary:    Rectum: Guaiac result negative. No external hemorrhoid or internal hemorrhoid. Normal anal tone.  Neurological:     General: No focal deficit present.      Mental Status: She is alert and oriented to person, place, and time.     GCS: GCS eye subscore is 4. GCS verbal subscore is 5. GCS motor subscore is 6.  Psychiatric:        Attention and Perception: Attention normal.        Mood and Affect: Mood normal.        Speech: Speech normal.        Behavior: Behavior is cooperative.      UC Treatments / Results  Labs (all labs ordered are listed, but only abnormal results are displayed) Labs Reviewed  OCCULT BLOOD X 1 CARD TO LAB, STOOL    EKG   Radiology No results found.  Procedures Procedures (including critical care time)  Medications Ordered in UC Medications - No data to display  Initial Impression / Assessment and Plan / UC Course  I have reviewed the triage vital signs and the nursing notes.  Pertinent labs & imaging results that were available during my care of the patient were reviewed by me and considered in my medical decision making (see chart for details).    Discussed exam findings and plan of care with patient, via translator Hildegard, pt allowed opportunity for question and answers, strict go to ER precautions given.   Patient verbalized understanding to this provider.  Ddx: Abdominal pain, colitis, appendicitis, rectal bleeding, hemorrhoid, GI ulcer Final Clinical Impressions(s) / UC Diagnoses   Final diagnoses:  RLQ abdominal pain     Discharge Instructions      Go to ER for further evaluation of RLQ abdominal pain, reported BRBPR x 3 last 24 hours(labs/imaging)     ED Prescriptions   None    PDMP not reviewed this encounter.   Aminta Loose, NP 09/21/24 2124

## 2024-09-21 NOTE — ED Triage Notes (Signed)
 Professional interpretor service used for clinical intake.   Pt states she had a BM which had a lot of BRB which continued after the BM yesterday and today. She complains of lower abdominal pain which comes and goes. She has no taken any meds for sx.

## 2024-09-21 NOTE — ED Notes (Signed)
 Patient is being discharged from the Urgent Care and sent to the Emergency Department via POV . Per Rilla Flood NP, patient is in need of higher level of care due to rectal bleeding and abdominal pain. Patient is aware and verbalizes understanding of plan of care.  Vitals:   09/21/24 1439  BP: 101/69  Pulse: 79  Resp: 16  Temp: 98.1 F (36.7 C)  SpO2: 98%

## 2024-09-22 ENCOUNTER — Emergency Department (HOSPITAL_COMMUNITY)

## 2024-09-22 LAB — LIPASE, BLOOD: Lipase: 28 U/L (ref 11–51)

## 2024-09-22 LAB — POC OCCULT BLOOD, ED: Fecal Occult Bld: NEGATIVE

## 2024-09-22 MED ORDER — IOHEXOL 350 MG/ML SOLN
75.0000 mL | Freq: Once | INTRAVENOUS | Status: AC | PRN
Start: 2024-09-22 — End: 2024-09-22
  Administered 2024-09-22: 75 mL via INTRAVENOUS

## 2024-09-22 NOTE — ED Notes (Signed)
 Patient transported to CT

## 2024-09-22 NOTE — Discharge Instructions (Signed)
 ### ??????? ???? ????? ????     **??????? ?????? ??? ??? ???? ????? ????? ????**      ?????? ????????      ?? ????? ????? ?? ???????? ???? ??? ???? ????? ?? ???? ????. ????? ??? ????? ????? ?????????? ?? ???? ?????? ????? ?????? ?????? ?? ????????? ??? ??? ????? ????????? ??????? ????? ?????? ??????? ????? ???? ???.      **?. ?????? ????????? ????????**      - ????? ??? ?????? ????? ??????? ?????? ??????.      - ????? ??? ????? ???? ?? ??????? ?????? ?? ??????? ?? ????? ?? ??????.      **?. ?????? ???????**      - ?????? ????? ???? ???????? ??? ?????? ???????? ??????? ??????? ?????? ??????? ?????? ??? ????? ?????.[1]      - ????? ????? ????? ?? ????? ?????? (?.?-? ???).      **?. ???????**      - ??? ?? ??? ????? ??? ?????? ????? ?? ??????? ????????? ??? ??????? ?????? ???.      - ????? ??????? ???????? ?? ?????? ???????? ??? ??????????? (NSAIDs) ??? ??? ???? ?????? ????? ????? ?? ???? ??????.[2][3]      **?. ?????? ????? ???? ?????? ?????? ???????? ?????**      - ????? ???? ?????? ?? ??????? ?????? ????? ?? ?????.      - ???? ?? ???? ?? ?????? ????? ?? ??????.      - ??? ???? ?? ?????? ?? ?????.      - ???? ???? ?? ????? ?? ????? ????? ?????.      - ?????? ???? ??????? ?? ???? ????? ????? ??? ??????? ?????? ?? ????? ????? ??? ??????.[2][4][5]      **?. ???????? ??????**      - ??? ?????? ?????? ?????? ???? ??????? ?? ??? ???? ??????? ??????.      - ?? ??? ??????? ??????? ?? ??? ??????? ?? ??????? ?????? ????? ??????? (??? ????????) ???????? ????? ????? ??? ?????? ??????? ?? ????? ??????? ?????????? ???? ?? ?????? ??? ?? 50 ?? ????? ???? ????? ??? ????? ??????? ????????.[6][4][5][2]      **?. ????? ????**      - ????? ??? ????? ????? ?????? ???????? ????? ?????? ??? ??????.      - ????? ?????? ?????? ????? ??? ???????.      - ??? ??? ?????? ??????? ????????? ?? ?? ????? ???? ??????? ?????? ?????? ??? ??? ??????.[7][8][1]      **?. ????? ????????**                   ### References  1. Telephonic Management of Rectal Bleeding in Young Adults: A Prospective Randomized Controlled Trial. Raje D, Scott M, Irvine T, et al. Colorectal Disease : The Official Journal of the Association of Coloproctology of [place]. 2007;9(1):86-9. doi:10.1111/j.1463-1318.2006.01049.x. 2. Acute Lower Gastrointestinal Bleeding. Gralnek IM, Neeman Z, Strate LL. The [place] Journal of Medicine. 2017;376(11):1054-1063. doi:10.1056/NEJMcp1603455. 3. Management of Patients With Acute Lower Gastrointestinal Bleeding: An Updated ACG Guideline. Sengupta N, Feuerstein JD, Jairath V, et al. The American Journal of Gastroenterology. 2023;118(2):208-231. doi:10.14309/ajg.0000000000002130. 4. Red Flag Signs and Symptoms for Patients With Early-Onset Colorectal Cancer: A Systematic Review and Meta-Analysis. Demb JINNY Morrie COVER, Dounel J, et al. JAMA Network Open. 2024;7(5):e2413157. doi:10.1001/jamanetworkopen.7975.86842. 5. Diagnostic Yield of Colonoscopy in Young Adults With Lower Gastrointestinal Symptoms in a Multicenter [place] Cohort. Macie JONELLE Arliss ONEIDA Deatrice JONETTA, et al. Digestive Diseases ([address] [place]). 2020;38(6):484-489. doi:10.1159/000506073. 6. Time to Endoscopy or Colonoscopy Among Adults  Younger Than 50 Years With Iron-Deficiency Anemia and/or Hematochezia in the [facility]. Demb JINNY Arminda LITTIE Beverley CC, et al. JAMA Network Open. 2023;6(11):e2341516. doi:10.1001/jamanetworkopen.7976.58483. 7. Hemorrhoids. Teressa BIRCH. The [place] Journal of Medicine. 2014;371(10):944-51. doi:10.1056/NEJMcp1204188. 8. Hemorrhoidal Disease. Merrily[facility]. JAMA. 7974;:7162224. doi:10.1001/jama.7974.86916.

## 2024-09-22 NOTE — ED Provider Notes (Addendum)
 [place] EMERGENCY DEPARTMENT AT [facility] Provider Note   CSN: [id number] Arrival date & time: 09/21/24  2127     Patient presents with: Abdominal Pain and Blood In Stools   Susan Carroll is a 23 y.o. female.   HPI History obtained through Arabic interpreter 23 year old female presents today complaining of lower abdominal pain and rectal bleeding.  She reports that this began 2 days ago.  Pain has been constant.  She has been eating and drinking without difficulty.  She reports taking a weight loss medication.  She denies any fever, vomiting, or diarrhea.  She denies any chest pain or shortness of breath.  She reports that she is not sexually active and menstrual cycles are normal.  Denies any vaginal discharge.  She reports some bleeding per rectum.    Prior to Admission medications   Medication Sig Start Date End Date Taking? Authorizing Provider  acetaminophen  (TYLENOL ) 325 MG tablet Take 2 tablets (650 mg total) by mouth every 6 (six) hours as needed for mild pain or moderate pain. 09/14/23   Johnie Flaming A, NP  ibuprofen  (ADVIL ) 800 MG tablet Take 1 tablet (800 mg total) by mouth 3 (three) times daily as needed for mild pain, moderate pain or cramping. 09/14/23   Johnie Flaming A, NP  ondansetron  (ZOFRAN ) 4 MG tablet Take 1 tablet (4 mg total) by mouth every 6 (six) hours as needed for nausea or vomiting. 09/14/23   Johnie Flaming A, NP  phentermine 15 MG capsule TAKE 1 CAPSULE BY MOUTH EVERY DAY FOR WEIGHT LOSS    [provider]  tranexamic acid  (LYSTEDA ) 650 MG TABS tablet Take 2 tablets (1,300 mg total) by mouth 3 (three) times daily. Take during menses for a maximum of five days 02/17/24   Eveline Lynwood MATSU, MD    Allergies: Patient has no known allergies.    Review of Systems  Updated Vital Signs BP 101/62   Pulse 92   Temp (!) 97.2 F (36.2 C) (Oral)   Resp 18   Wt 74.8 kg   LMP 09/15/2024 (Exact Date)   SpO2 100%   BMI 29.21 kg/m    Physical Exam Vitals and nursing note reviewed.  HENT:     Head: Normocephalic and atraumatic.     Mouth/Throat:     Mouth: Mucous membranes are moist.  Eyes:     Extraocular Movements: Extraocular movements intact.  Cardiovascular:     Rate and Rhythm: Normal rate.     Heart sounds: Normal heart sounds.  Pulmonary:     Effort: Pulmonary effort is normal.     Breath sounds: Normal breath sounds.  Abdominal:     General: Abdomen is flat. Bowel sounds are normal.     Palpations: Abdomen is soft.     Tenderness: There is abdominal tenderness in the right lower quadrant, suprapubic area and left lower quadrant.  Genitourinary:    Rectum: Normal. Guaiac result negative.  Skin:    General: Skin is warm and dry.  Neurological:     General: No focal deficit present.     Mental Status: She is alert.  Psychiatric:        Mood and Affect: Mood normal.     (all labs ordered are listed, but only abnormal results are displayed) Labs Reviewed  CBC - Abnormal; Notable for the following components:      Result Value   MCV 79.7 (*)    MCH 25.7 (*)    Platelets  419 (*)    All other components within normal limits  URINALYSIS, ROUTINE W REFLEX MICROSCOPIC - Abnormal; Notable for the following components:   APPearance HAZY (*)    Hgb urine dipstick SMALL (*)    Leukocytes,Ua SMALL (*)    All other components within normal limits  LIPASE, BLOOD  COMPREHENSIVE METABOLIC PANEL WITH GFR  HCG, SERUM, QUALITATIVE  POC OCCULT BLOOD, ED  POC OCCULT BLOOD, ED    EKG: EKG Interpretation Date/Time:  Tuesday September 21 2024 22:12:31 EDT Ventricular Rate:  74 PR Interval:  124 QRS Duration:  80 QT Interval:  386 QTC Calculation: 428 R Axis:   84  Text Interpretation: Sinus rhythm with Premature atrial complexes Otherwise normal ECG Confirmed by Midge Golas ([id number]) on 09/22/2024 9:50:08 AM  Radiology: CT ABDOMEN PELVIS W CONTRAST Result Date: 09/22/2024 CLINICAL DATA:  Acute  nonlocalized abdominal pain. Bloody stools in the afternoon. EXAM: CT ABDOMEN AND PELVIS WITH CONTRAST TECHNIQUE: Multidetector CT imaging of the abdomen and pelvis was performed using the standard protocol following bolus administration of intravenous contrast. RADIATION DOSE REDUCTION: This exam was performed according to the departmental dose-optimization program which includes automated exposure control, adjustment of the mA and/or kV according to patient size and/or use of iterative reconstruction technique. CONTRAST:  75mL OMNIPAQUE  IOHEXOL  350 MG/ML SOLN COMPARISON:  Ultrasound pelvis 06/17/2024. CT abdomen pelvis 09/11/2022 FINDINGS: Lower chest: Lung bases are clear. Hepatobiliary: Focal low-attenuation area in the left lobe of the liver likely representing focal fatty infiltration. No change. No other focal liver lesions identified. Gallbladder and bile ducts are normal. Pancreas: Unremarkable. No pancreatic ductal dilatation or surrounding inflammatory changes. Spleen: Normal in size without focal abnormality. Adrenals/Urinary Tract: No adrenal gland nodules. Kidneys are symmetrical. Nephrograms are homogeneous. No hydronephrosis or hydroureter. Bladder is normal. Stomach/Bowel: Stomach, small bowel, and colon are not abnormally distended. No wall thickening or inflammatory stranding appreciated. Scattered stool throughout the colon. Appendix is normal. Vascular/Lymphatic: No significant vascular findings are present. No enlarged abdominal or pelvic lymph nodes. Reproductive: Uterus and ovaries are not enlarged. No abnormal adnexal masses or collections. Other: No free air or free fluid in the abdomen. Abdominal wall musculature appears intact. Musculoskeletal: No acute or significant osseous findings. IMPRESSION: 1. No evidence of bowel obstruction or inflammation. 2. Small area of focal fatty infiltration in the liver. No follow-up is indicated. Electronically Signed   By: Elsie Gravely M.D.   On:  09/22/2024 15:57     Procedures   Medications Ordered in the ED  iohexol  (OMNIPAQUE ) 350 MG/ML injection 75 mL (75 mLs Intravenous Contrast Given 09/22/24 1550)    Clinical Course as of 09/22/24 1617  Wed Sep 22, 2024  1601 CT ABDOMEN PELVIS W CONTRAST [DR]  1601 CT ABDOMEN PELVIS W CONTRAST [DR]  1602 CT abdomen pelvis without acute abnormality noted [DR]    Clinical Course User Index [DR] Levander Houston, MD                                 Medical Decision Making Amount and/or Complexity of Data Reviewed Labs: ordered. Radiology: ordered. Decision-making details documented in ED Course.  Risk Prescription drug management.   23 year old female presents today complaining of rectal bleeding and lower abdominal pain.  Patient evaluated here with physical exam Patient has bilateral lower abdominal pain. Rectal exam was performed that did show stool that was not bloody and Hemoccult negative. Differential diagnosis includes  but is not limited to ulcerative colitis, Crohn's, appendicitis, diverticulitis, diseases of female GI tract, CT obtained that shows no evidence of ulcerative colitis, Crohn's, diverticulitis or other acute intra-abdominal abnormalities.  Doubt diseases of the female GI tract and this sexually inactive female who is not having any vaginal discharge and rectal bleeding. No obvious hemorrhoids noted. Patient did have tender rectal exam would consider anal fissure No evidence of infectious colitis, inflammatory bowel disease, and no polyps noted.  Meckel's diverticulum considered.  Patient will need further workup if she has continued bleeding.  Patient does not have active rectal bleeding here in the ED and is hemodynamically stable No definite etiology of abdominal pain or bleeding noted. Patient advised to hold phentermine.  She is advised of return precautions and need for follow-up.    Final diagnoses:  Lower abdominal pain  Rectal bleeding    ED  Discharge Orders     None          Levander Houston, MD 09/22/24 1551    Levander Houston, MD 09/22/24 [phone]

## 2024-10-13 ENCOUNTER — Other Ambulatory Visit (HOSPITAL_COMMUNITY)
Admission: RE | Admit: 2024-10-13 | Discharge: 2024-10-13 | Disposition: A | Discharge status: Home / Self Care | Source: Ambulatory Visit | Attending: Physician Assistant | Admitting: Physician Assistant

## 2024-10-13 ENCOUNTER — Encounter: Payer: Self-pay | Admitting: Physician Assistant

## 2024-10-13 ENCOUNTER — Ambulatory Visit: Admitting: Physician Assistant

## 2024-10-13 VITALS — Wt 156.2 lb

## 2024-10-13 DIAGNOSIS — R102 Pelvic and perineal pain unspecified side: Secondary | ICD-10-CM

## 2024-10-13 DIAGNOSIS — T7421XS Adult sexual abuse, confirmed, sequela: Secondary | ICD-10-CM

## 2024-10-13 MED ORDER — NAPROXEN 500 MG PO TABS: 500.0000 mg | ORAL_TABLET | Freq: Two times a day (BID) | ORAL | 2 refills | Status: DC

## 2024-10-13 NOTE — Progress Notes (Signed)
 Pt presents for vaginal discharge, itching, and abdominal pain. Pt is having irregular bleeding and seems to think she has PCOS
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
## 2024-10-14 LAB — CERVICOVAGINAL ANCILLARY ONLY
Bacterial Vaginitis (gardnerella): POSITIVE — AB
Candida Glabrata: NEGATIVE
Candida Vaginitis: POSITIVE — AB
Chlamydia: NEGATIVE
Comment: NEGATIVE
Comment: NEGATIVE
Comment: NEGATIVE
Comment: NEGATIVE
Comment: NEGATIVE
Comment: NORMAL
Neisseria Gonorrhea: NEGATIVE
Trichomonas: NEGATIVE

## 2024-10-15 LAB — CYTOLOGY - PAP: Diagnosis: NEGATIVE

## 2024-10-16 LAB — URINE CULTURE

## 2024-10-18 ENCOUNTER — Other Ambulatory Visit: Payer: Self-pay

## 2024-10-18 ENCOUNTER — Ambulatory Visit: Payer: Self-pay

## 2024-10-18 MED ORDER — FLUCONAZOLE 150 MG PO TABS
150.0000 mg | ORAL_TABLET | Freq: Once | ORAL | 0 refills | Status: AC
Start: 2024-10-18 — End: 2024-10-18

## 2024-10-18 MED ORDER — METRONIDAZOLE 500 MG PO TABS: 500.0000 mg | ORAL_TABLET | Freq: Two times a day (BID) | ORAL | 0 refills | Status: DC

## 2024-10-20 MED ORDER — BLISOVI 24 FE 1-20 MG-MCG(24) PO TABS: 1.0000 | ORAL_TABLET | Freq: Every day | ORAL | 3 refills | Status: AC

## 2024-12-06 ENCOUNTER — Telehealth (HOSPITAL_COMMUNITY): Payer: Self-pay

## 2024-12-06 ENCOUNTER — Encounter (HOSPITAL_COMMUNITY): Payer: Self-pay

## 2024-12-06 ENCOUNTER — Ambulatory Visit (HOSPITAL_COMMUNITY): Admission: EM | Admit: 2024-12-06 | Discharge: 2024-12-06 | Disposition: A | Discharge status: Home / Self Care

## 2024-12-06 ENCOUNTER — Ambulatory Visit (HOSPITAL_COMMUNITY)

## 2024-12-06 DIAGNOSIS — R350 Frequency of micturition: Secondary | ICD-10-CM

## 2024-12-06 DIAGNOSIS — R051 Acute cough: Secondary | ICD-10-CM

## 2024-12-06 LAB — POC COVID19/FLU A&B COMBO
Covid Antigen, POC: NEGATIVE
Influenza A Antigen, POC: POSITIVE — AB
Influenza B Antigen, POC: POSITIVE — AB

## 2024-12-06 LAB — POCT URINE DIPSTICK
Bilirubin, UA: NEGATIVE
Glucose, UA: NEGATIVE mg/dL
Ketones, POC UA: NEGATIVE mg/dL
Nitrite, UA: NEGATIVE
POC PROTEIN,UA: NEGATIVE
Spec Grav, UA: 1.025 (ref 1.010–1.025)
Urobilinogen, UA: 0.2 U/dL
pH, UA: 6 (ref 5.0–8.0)

## 2024-12-06 MED ORDER — NITROFURANTOIN MONOHYD MACRO 100 MG PO CAPS: 100.0000 mg | ORAL_CAPSULE | Freq: Two times a day (BID) | ORAL | 0 refills | Status: DC

## 2024-12-06 MED ORDER — OSELTAMIVIR PHOSPHATE 75 MG PO CAPS: 75.0000 mg | ORAL_CAPSULE | Freq: Two times a day (BID) | ORAL | 0 refills | Status: DC

## 2024-12-06 MED ORDER — OSELTAMIVIR PHOSPHATE 75 MG PO CAPS: 75.0000 mg | ORAL_CAPSULE | Freq: Two times a day (BID) | ORAL | 0 refills | Status: AC

## 2024-12-06 MED ORDER — NITROFURANTOIN MONOHYD MACRO 100 MG PO CAPS: 100.0000 mg | ORAL_CAPSULE | Freq: Two times a day (BID) | ORAL | 0 refills | Status: AC

## 2024-12-06 NOTE — ED Triage Notes (Signed)
 Pt c/o cough, fever, weak, headache, and body aches x3 days. States took tylenol  this am.

## 2024-12-06 NOTE — ED Provider Notes (Signed)
 [facility]    CSN: [id number] Arrival date & time: 12/06/24  1428      History   Chief Complaint Chief Complaint  Patient presents with   Cough    HPI Susan Carroll is a 23 y.o. female.   This 23 year old female is being seen for complaints of subjective fever, sore throat, cough, nausea, diarrhea, weakness, body aches.  She reports symptom onset Friday.  She has taken Tylenol  without relief of symptoms.  She endorses headache, sore chest.  She reports urinary frequency.  She denies ear pain, shortness of breath.  She denies abdominal pain, constipation.   Cough Associated symptoms: chills, fever, myalgias, rhinorrhea and sore throat   Associated symptoms: no chest pain, no ear pain and no shortness of breath     History reviewed. No pertinent past medical history.  Patient Active Problem List   Diagnosis Date Noted   RLQ abdominal pain 09/21/2024    Past Surgical History:  Procedure Laterality Date   DILATION AND CURETTAGE OF UTERUS     At age 45    OB History   No obstetric history on file.      Home Medications    Prior to Admission medications   Medication Sig Start Date End Date Taking? Authorizing Provider  nitrofurantoin , macrocrystal-monohydrate, (MACROBID ) 100 MG capsule Take 1 capsule (100 mg total) by mouth 2 (two) times daily. 12/06/24  Yes Laterra Lubinski C, FNP  oseltamivir  (TAMIFLU ) 75 MG capsule Take 1 capsule (75 mg total) by mouth every 12 (twelve) hours. 12/06/24  Yes Idora Brosious C, FNP  acetaminophen  (TYLENOL ) 325 MG tablet Take 2 tablets (650 mg total) by mouth every 6 (six) hours as needed for mild pain or moderate pain. Patient not taking: Reported on 10/13/2024 09/14/23   Johnie Flaming A, NP  ibuprofen  (ADVIL ) 800 MG tablet Take 1 tablet (800 mg total) by mouth 3 (three) times daily as needed for mild pain, moderate pain or cramping. Patient not taking: Reported on 10/13/2024 09/14/23   Johnie Flaming A, NP  Norethindrone  Acetate-Ethinyl Estrad-FE (BLISOVI  24 FE) 1-20 MG-MCG(24) tablet Take 1 tablet by mouth daily. 10/20/24   Davis, Devon E, PA-C  ondansetron  (ZOFRAN ) 4 MG tablet Take 1 tablet (4 mg total) by mouth every 6 (six) hours as needed for nausea or vomiting. Patient not taking: Reported on 10/13/2024 09/14/23   Johnie Flaming A, NP  phentermine 15 MG capsule TAKE 1 CAPSULE BY MOUTH EVERY DAY FOR WEIGHT LOSS Patient not taking: Reported on 10/13/2024    [provider]    Family History History reviewed. No pertinent family history.  Social History Social History   Tobacco Use   Smoking status: Never   Smokeless tobacco: Never  Vaping Use   Vaping status: Never Used  Substance Use Topics   Alcohol use: Never   Drug use: Never     Allergies   Patient has no known allergies.   Review of Systems Review of Systems  Constitutional:  Positive for activity change, chills and fever.  HENT:  Positive for rhinorrhea and sore throat. Negative for ear pain and trouble swallowing.   Respiratory:  Positive for cough. Negative for shortness of breath.   Cardiovascular:  Negative for chest pain.  Gastrointestinal:  Positive for diarrhea and nausea. Negative for abdominal pain.  Genitourinary:  Positive for frequency. Negative for difficulty urinating, dysuria and urgency.  Musculoskeletal:  Positive for arthralgias and myalgias.  Neurological:  Positive for weakness. Negative for  dizziness.     Physical Exam Triage Vital Signs ED Triage Vitals  Encounter Vitals Group     BP 12/06/24 1603 117/79     Girls Systolic BP Percentile --      Girls Diastolic BP Percentile --      Boys Systolic BP Percentile --      Boys Diastolic BP Percentile --      Pulse Rate 12/06/24 1603 74     Resp 12/06/24 1603 18     Temp 12/06/24 1603 97.9 F (36.6 C)     Temp Source 12/06/24 1603 Oral     SpO2 12/06/24 1603 98 %     Weight --      Height --      Head Circumference --      Peak Flow --       Pain Score 12/06/24 1601 6     Pain Loc --      Pain Education --      Exclude from Growth Chart --    No data found.  Updated Vital Signs BP 117/79 (BP Location: Right Arm)   Pulse 74   Temp 97.9 F (36.6 C) (Oral)   Resp 18   LMP 11/29/2024 (Approximate)   SpO2 98%   Visual Acuity Right Eye Distance:   Left Eye Distance:   Bilateral Distance:    Right Eye Near:   Left Eye Near:    Bilateral Near:     Physical Exam Vitals and nursing note reviewed.  Constitutional:      General: She is not in acute distress.    Appearance: She is well-developed. She is not toxic-appearing.     Comments: Pleasant female appearing stated age found sitting in chair in no acute distress.  HENT:     Head: Normocephalic and atraumatic.     Right Ear: Tympanic membrane and external ear normal.     Left Ear: Tympanic membrane and external ear normal.     Nose: Nose normal.     Mouth/Throat:     Lips: Pink.     Mouth: Mucous membranes are moist.     Pharynx: No oropharyngeal exudate or posterior oropharyngeal erythema.  Eyes:     Conjunctiva/sclera: Conjunctivae normal.  Cardiovascular:     Rate and Rhythm: Normal rate and regular rhythm.     Heart sounds: Normal heart sounds. No murmur heard. Pulmonary:     Effort: Pulmonary effort is normal. No tachypnea, respiratory distress or retractions.     Breath sounds: Decreased breath sounds present.  Abdominal:     General: Bowel sounds are normal.     Palpations: Abdomen is soft.     Tenderness: There is abdominal tenderness in the suprapubic area.  Musculoskeletal:        General: No swelling.  Skin:    General: Skin is warm and dry.  Neurological:     Mental Status: She is alert.  Psychiatric:        Mood and Affect: Mood normal.      UC Treatments / Results  Labs (all labs ordered are listed, but only abnormal results are displayed) Labs Reviewed  POC COVID19/FLU A&B COMBO - Abnormal; Notable for the following  components:      Result Value   Influenza A Antigen, POC Positive (*)    Influenza B Antigen, POC Positive (*)    All other components within normal limits  POCT URINE DIPSTICK - Abnormal; Notable for the following components:  Clarity, UA cloudy (*)    Blood, UA trace-intact (*)    Leukocytes, UA Small (1+) (*)    All other components within normal limits  POC COVID19/FLU A&B COMBO    EKG   Radiology DG Chest 2 View Result Date: 12/06/2024 CLINICAL DATA:  Cough, fever, body aches for 3 days EXAM: CHEST - 2 VIEW COMPARISON:  04/29/2022 FINDINGS: Frontal and lateral views of the chest demonstrate an unremarkable cardiac silhouette. No acute airspace disease, effusion, or pneumothorax. No acute bony abnormalities. IMPRESSION: 1. No acute intrathoracic process. Electronically Signed   By: Ozell Daring M.D.   On: 12/06/2024 17:14    Procedures Procedures (including critical care time)  Medications Ordered in UC Medications - No data to display  Initial Impression / Assessment and Plan / UC Course  I have reviewed the triage vital signs and the nursing notes.  Pertinent labs & imaging results that were available during my care of the patient were reviewed by me and considered in my medical decision making (see chart for details).     Vitals and triage reviewed, patient is hemodynamically stable.  COVID/flu swab obtained.  Patient is positive for influenza A and influenza B.  Obtained chest x-ray due to diminished breath sounds, chest x-ray negative for acute findings.  Obtained point-of-care urinalysis due to complaints of urinary frequency and suprapubic tenderness.  Urinalysis with 1+ leukocytes.  She is provided prescription for Tamiflu  and Macrobid .  Advised to continue Tylenol , ibuprofen  for pain/fever/body aches.  Encouraged fluid intake to maintain hydration.  Guaifenesin or dextromethorphan for cough.  Plan of care, follow-up care, return precautions given, no questions at  this time.  Work note provided. Final Clinical Impressions(s) / UC Diagnoses   Final diagnoses:  Acute cough  Urinary frequency     Discharge Instructions      You have tested positive for influenza A and influenza B. Your urinalysis is suggestive of urinary tract infection.  You have been prescribed Tamiflu .  Take 1 capsule every 12 hours for 5 days.  You have been prescribed Macrobid .  Take 1 tablet by mouth twice daily for 5 days.  If any changes are needed in your antibiotic, you will be notified via telephone.  Continue Tylenol /acetaminophen  and/or Advil /ibuprofen  for fever, body aches, pain.  Take guaifenesin or dextromethorphan for cough.  You can mix 2 teaspoons of honey in 8 ounces of warm water, drink for sore throat. Mix 1/2 - 1 teaspoon of salt in 8 ounces of warm water, gargle and spit for sore throat.  Cool-mist humidifier in bedroom at night can help soothe cough.  Get plenty of rest. Drink plenty of fluids to stay hydrated.  If you develop any new or worsening symptoms or if your symptoms do not start to improve, please return here or follow-up with your primary care provider.  If your symptoms are severe, please go to the emergency room.       ED Prescriptions     Medication Sig Dispense Auth. Provider   oseltamivir  (TAMIFLU ) 75 MG capsule Take 1 capsule (75 mg total) by mouth every 12 (twelve) hours. 10 capsule Yaiza Palazzola C, FNP   nitrofurantoin , macrocrystal-monohydrate, (MACROBID ) 100 MG capsule Take 1 capsule (100 mg total) by mouth 2 (two) times daily. 10 capsule Camryn Lampson C, FNP      PDMP not reviewed this encounter.   Lennice Jon BROCKS, FNP 12/06/24 [phone]

## 2024-12-06 NOTE — Discharge Instructions (Addendum)
 You have tested positive for influenza A and influenza B. Your urinalysis is suggestive of urinary tract infection.  You have been prescribed Tamiflu .  Take 1 capsule every 12 hours for 5 days.  You have been prescribed Macrobid .  Take 1 tablet by mouth twice daily for 5 days.  If any changes are needed in your antibiotic, you will be notified via telephone.  Continue Tylenol /acetaminophen  and/or Advil /ibuprofen  for fever, body aches, pain.  Take guaifenesin or dextromethorphan for cough.  You can mix 2 teaspoons of honey in 8 ounces of warm water, drink for sore throat. Mix 1/2 - 1 teaspoon of salt in 8 ounces of warm water, gargle and spit for sore throat.  Cool-mist humidifier in bedroom at night can help soothe cough.  Get plenty of rest. Drink plenty of fluids to stay hydrated.  If you develop any new or worsening symptoms or if your symptoms do not start to improve, please return here or follow-up with your primary care provider.  If your symptoms are severe, please go to the emergency room.

## 2024-12-06 NOTE — Telephone Encounter (Signed)
 Patient requested different pharmacy
